# Patient Record
Sex: Female | Born: 1960 | Race: Black or African American | Hispanic: No | Marital: Single | Smoking: Never smoker
Health system: Southern US, Community
[De-identification: ages and names within clinical notes are randomized; demographics above are authoritative.]

## PROBLEM LIST (undated history)

## (undated) DIAGNOSIS — N200 Calculus of kidney: Secondary | ICD-10-CM

## (undated) DIAGNOSIS — E119 Type 2 diabetes mellitus without complications: Secondary | ICD-10-CM

## (undated) DIAGNOSIS — I10 Essential (primary) hypertension: Secondary | ICD-10-CM

## (undated) HISTORY — PX: COLONOSCOPY: SHX174

## (undated) HISTORY — DX: Essential (primary) hypertension: I10

## (undated) HISTORY — PX: TONSILLECTOMY: SUR1361

## (undated) HISTORY — DX: Type 2 diabetes mellitus without complications: E11.9

---
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
## 1999-02-08 ENCOUNTER — Other Ambulatory Visit: Admission: RE | Admit: 1999-02-08 | Discharge: 1999-02-08 | Payer: Self-pay | Admitting: Obstetrics

## 2000-10-02 ENCOUNTER — Other Ambulatory Visit: Admission: RE | Admit: 2000-10-02 | Discharge: 2000-10-02 | Payer: Self-pay | Admitting: Obstetrics

## 2000-12-28 ENCOUNTER — Encounter: Payer: Self-pay | Admitting: Obstetrics

## 2000-12-28 ENCOUNTER — Ambulatory Visit (HOSPITAL_COMMUNITY): Admission: RE | Admit: 2000-12-28 | Discharge: 2000-12-28 | Payer: Self-pay | Admitting: Obstetrics

## 2014-12-04 DIAGNOSIS — N200 Calculus of kidney: Secondary | ICD-10-CM

## 2014-12-04 HISTORY — DX: Calculus of kidney: N20.0

## 2015-04-22 ENCOUNTER — Other Ambulatory Visit (HOSPITAL_COMMUNITY): Payer: Self-pay | Admitting: Internal Medicine

## 2015-04-22 ENCOUNTER — Ambulatory Visit (HOSPITAL_COMMUNITY)
Admission: RE | Admit: 2015-04-22 | Discharge: 2015-04-22 | Disposition: A | Discharge status: Home / Self Care | Payer: 59 | Source: Ambulatory Visit | Attending: Internal Medicine | Admitting: Internal Medicine

## 2015-04-22 DIAGNOSIS — M545 Low back pain, unspecified: Secondary | ICD-10-CM

## 2015-04-22 DIAGNOSIS — N201 Calculus of ureter: Secondary | ICD-10-CM | POA: Insufficient documentation

## 2015-04-22 DIAGNOSIS — R109 Unspecified abdominal pain: Secondary | ICD-10-CM | POA: Diagnosis present

## 2015-04-22 DIAGNOSIS — R310 Gross hematuria: Secondary | ICD-10-CM | POA: Diagnosis not present

## 2015-05-16 ENCOUNTER — Encounter (HOSPITAL_COMMUNITY): Payer: Self-pay

## 2015-05-16 ENCOUNTER — Emergency Department (HOSPITAL_COMMUNITY)
Admission: EM | Admit: 2015-05-16 | Discharge: 2015-05-16 | Disposition: A | Discharge status: Home / Self Care | Payer: 59 | Attending: Emergency Medicine | Admitting: Emergency Medicine

## 2015-05-16 DIAGNOSIS — N200 Calculus of kidney: Secondary | ICD-10-CM | POA: Insufficient documentation

## 2015-05-16 DIAGNOSIS — R109 Unspecified abdominal pain: Secondary | ICD-10-CM | POA: Diagnosis present

## 2015-05-16 LAB — URINALYSIS, ROUTINE W REFLEX MICROSCOPIC
Bilirubin Urine: NEGATIVE
Glucose, UA: NEGATIVE mg/dL
KETONES UR: NEGATIVE mg/dL
Leukocytes, UA: NEGATIVE
Nitrite: NEGATIVE
PH: 8 (ref 5.0–8.0)
Protein, ur: NEGATIVE mg/dL
Specific Gravity, Urine: 1.012 (ref 1.005–1.030)
Urobilinogen, UA: 0.2 mg/dL (ref 0.0–1.0)

## 2015-05-16 LAB — CBC WITH DIFFERENTIAL/PLATELET
Basophils Absolute: 0 10*3/uL (ref 0.0–0.1)
Basophils Relative: 0 % (ref 0–1)
Eosinophils Absolute: 0 10*3/uL (ref 0.0–0.7)
Eosinophils Relative: 0 % (ref 0–5)
HCT: 41.4 % (ref 36.0–46.0)
Hemoglobin: 13.8 g/dL (ref 12.0–15.0)
Lymphocytes Relative: 17 % (ref 12–46)
Lymphs Abs: 2 10*3/uL (ref 0.7–4.0)
MCH: 30.3 pg (ref 26.0–34.0)
MCHC: 33.3 g/dL (ref 30.0–36.0)
MCV: 90.8 fL (ref 78.0–100.0)
Monocytes Absolute: 0.9 10*3/uL (ref 0.1–1.0)
Monocytes Relative: 8 % (ref 3–12)
Neutro Abs: 8.4 10*3/uL — ABNORMAL HIGH (ref 1.7–7.7)
Neutrophils Relative %: 75 % (ref 43–77)
Platelets: 237 10*3/uL (ref 150–400)
RBC: 4.56 MIL/uL (ref 3.87–5.11)
RDW: 12.3 % (ref 11.5–15.5)
WBC: 11.3 10*3/uL — ABNORMAL HIGH (ref 4.0–10.5)

## 2015-05-16 LAB — BASIC METABOLIC PANEL
Anion gap: 11 (ref 5–15)
BUN: 21 mg/dL — ABNORMAL HIGH (ref 6–20)
CO2: 25 mmol/L (ref 22–32)
Calcium: 8.9 mg/dL (ref 8.9–10.3)
Chloride: 103 mmol/L (ref 101–111)
Creatinine, Ser: 1.31 mg/dL — ABNORMAL HIGH (ref 0.44–1.00)
GFR calc Af Amer: 53 mL/min — ABNORMAL LOW (ref >60)
GFR calc non Af Amer: 46 mL/min — ABNORMAL LOW (ref >60)
Glucose, Bld: 151 mg/dL — ABNORMAL HIGH (ref 65–99)
Potassium: 3.3 mmol/L — ABNORMAL LOW (ref 3.5–5.1)
Sodium: 139 mmol/L (ref 135–145)

## 2015-05-16 LAB — URINE MICROSCOPIC-ADD ON

## 2015-05-16 MED ORDER — OXYCODONE-ACETAMINOPHEN 5-325 MG PO TABS
2.0000 | ORAL_TABLET | Freq: Once | ORAL | Status: AC
  Administered 2015-05-16: 2 via ORAL
  Filled 2015-05-16: qty 2

## 2015-05-16 MED ORDER — ONDANSETRON HCL 4 MG/2ML IJ SOLN
4.0000 mg | Freq: Once | INTRAMUSCULAR | Status: AC
  Administered 2015-05-16: 4 mg via INTRAVENOUS
  Filled 2015-05-16: qty 2

## 2015-05-16 MED ORDER — ONDANSETRON HCL 4 MG PO TABS: 4.0000 mg | ORAL_TABLET | Freq: Four times a day (QID) | ORAL | Status: DC

## 2015-05-16 MED ORDER — OXYCODONE-ACETAMINOPHEN 5-325 MG PO TABS: 2.0000 | ORAL_TABLET | Freq: Four times a day (QID) | ORAL | Status: DC | PRN

## 2015-05-16 MED ORDER — HYDROMORPHONE HCL 1 MG/ML IJ SOLN
1.0000 mg | Freq: Once | INTRAMUSCULAR | Status: AC
  Administered 2015-05-16: 1 mg via INTRAMUSCULAR
  Filled 2015-05-16: qty 1

## 2015-05-16 MED ORDER — ONDANSETRON 4 MG PO TBDP
4.0000 mg | ORAL_TABLET | Freq: Once | ORAL | Status: AC
  Administered 2015-05-16: 4 mg via ORAL
  Filled 2015-05-16: qty 1

## 2015-05-16 MED ORDER — HYDROMORPHONE HCL 1 MG/ML IJ SOLN
1.0000 mg | Freq: Once | INTRAMUSCULAR | Status: DC
  Administered 2015-05-16: 1 mg via INTRAVENOUS
  Filled 2015-05-16: qty 1

## 2015-05-16 NOTE — Discharge Instructions (Signed)

## 2015-05-16 NOTE — ED Notes (Signed)
Pt complains of right flank pain since this evening, known 79mm kidney stone

## 2015-05-16 NOTE — ED Provider Notes (Signed)
CSN: [id number]     Arrival date & time 05/16/15  2004 History   First MD Initiated Contact with Patient 05/16/15 2017     Chief Complaint  Patient presents with  . Flank Pain     (Consider location/radiation/quality/duration/timing/severity/associated sxs/prior Treatment) HPI Comments: Patient presents to the emergency department with chief complaint of right flank pain. She states that she was recently diagnosed with a 6 mm kidney stone. She states that today is the first day that she has had pain from the kidney stone. She was initially diagnosed because of hematuria by her primary care provider. She denies any associated fevers, chills, nausea, vomiting, diarrhea, or constipation. She does report urinary frequency and hematuria. There are no aggravating or alleviating factors.  The history is provided by the patient. No language interpreter was used.    History reviewed. No pertinent past medical history. History reviewed. No pertinent past surgical history. History reviewed. No pertinent family history. History  Substance Use Topics  . Smoking status: Never Smoker   . Smokeless tobacco: Not on file  . Alcohol Use: No   OB History    No data available     Review of Systems  Constitutional: Negative for fever and chills.  Respiratory: Negative for shortness of breath.   Cardiovascular: Negative for chest pain.  Gastrointestinal: Positive for abdominal pain. Negative for nausea, vomiting, diarrhea and constipation.  Genitourinary: Negative for dysuria.  All other systems reviewed and are negative.     Allergies  Review of patient's allergies indicates not on file.  Home Medications   Prior to Admission medications   Not on File   BP 189/77 mmHg  Pulse 70  Temp(Src) 98.1 F (36.7 C) (Oral)  Resp 20  Ht 5\' 8"  (1.727 m)  Wt 175 lb (79.379 kg)  BMI 26.61 kg/m2  SpO2 98% Physical Exam  Constitutional: She is oriented to person, place, and time. She appears  well-developed and well-nourished.  HENT:  Head: Normocephalic and atraumatic.  Eyes: Conjunctivae and EOM are normal. Pupils are equal, round, and reactive to light.  Neck: Normal range of motion. Neck supple.  Cardiovascular: Normal rate and regular rhythm.  Exam reveals no gallop and no friction rub.   No murmur heard. Pulmonary/Chest: Effort normal and breath sounds normal. No respiratory distress. She has no wheezes. She has no rales. She exhibits no tenderness.  Abdominal: Soft. Bowel sounds are normal. She exhibits no distension and no mass. There is no tenderness. There is no rebound and no guarding.  Right CVA tenderness  Musculoskeletal: Normal range of motion. She exhibits no edema or tenderness.  Neurological: She is alert and oriented to person, place, and time.  Skin: Skin is warm and dry.  Psychiatric: She has a normal mood and affect. Her behavior is normal. Judgment and thought content normal.  Nursing note and vitals reviewed.   ED Course  Procedures (including critical care time) Results for orders placed or performed during the hospital encounter of 05/16/15  CBC with Differential/Platelet  Result Value Ref Range   WBC 11.3 (H) 4.0 - 10.5 K/uL   RBC 4.56 3.87 - 5.11 MIL/uL   Hemoglobin 13.8 12.0 - 15.0 g/dL   HCT 41.4 36.0 - 46.0 %   MCV 90.8 78.0 - 100.0 fL   MCH 30.3 26.0 - 34.0 pg   MCHC 33.3 30.0 - 36.0 g/dL   RDW 12.3 11.5 - 15.5 %   Platelets 237 150 - 400 K/uL   Neutrophils Relative %  75 43 - 77 %   Neutro Abs 8.4 (H) 1.7 - 7.7 K/uL   Lymphocytes Relative 17 12 - 46 %   Lymphs Abs 2.0 0.7 - 4.0 K/uL   Monocytes Relative 8 3 - 12 %   Monocytes Absolute 0.9 0.1 - 1.0 K/uL   Eosinophils Relative 0 0 - 5 %   Eosinophils Absolute 0.0 0.0 - 0.7 K/uL   Basophils Relative 0 0 - 1 %   Basophils Absolute 0.0 0.0 - 0.1 K/uL  Basic metabolic panel  Result Value Ref Range   Sodium 139 135 - 145 mmol/L   Potassium 3.3 (L) 3.5 - 5.1 mmol/L   Chloride 103 101 -  111 mmol/L   CO2 25 22 - 32 mmol/L   Glucose, Bld 151 (H) 65 - 99 mg/dL   BUN 21 (H) 6 - 20 mg/dL   Creatinine, Ser 1.31 (H) 0.44 - 1.00 mg/dL   Calcium 8.9 8.9 - 10.3 mg/dL   GFR calc non Af Amer 46 (L) >60 mL/min   GFR calc Af Amer 53 (L) >60 mL/min   Anion gap 11 5 - 15  Urinalysis, Routine w reflex microscopic (not at [facility])  Result Value Ref Range   Color, Urine YELLOW YELLOW   APPearance CLOUDY (A) CLEAR   Specific Gravity, Urine 1.012 1.005 - 1.030   pH 8.0 5.0 - 8.0   Glucose, UA NEGATIVE NEGATIVE mg/dL   Hgb urine dipstick SMALL (A) NEGATIVE   Bilirubin Urine NEGATIVE NEGATIVE   Ketones, ur NEGATIVE NEGATIVE mg/dL   Protein, ur NEGATIVE NEGATIVE mg/dL   Urobilinogen, UA 0.2 0.0 - 1.0 mg/dL   Nitrite NEGATIVE NEGATIVE   Leukocytes, UA NEGATIVE NEGATIVE  Urine microscopic-add on  Result Value Ref Range   WBC, UA 0-2 <3 WBC/hpf   RBC / HPF 7-10 <3 RBC/hpf   Ct Abdomen Pelvis Wo Contrast  04/22/2015   CLINICAL DATA:  Right flank pain with gross hematuria for several days  EXAM: CT ABDOMEN AND PELVIS WITHOUT CONTRAST  TECHNIQUE: Multidetector CT imaging of the abdomen and pelvis was performed following the standard protocol without IV contrast.  COMPARISON:  None.  FINDINGS: Lung bases are free of acute infiltrate or sizable effusion.  The liver is diffusely decreased in attenuation consistent with fatty infiltration. A rounded hypodensity is identified measuring 5.1 cm in greatest dimension likely representing a cyst. The spleen, adrenal glands and gallbladder are within normal limits. The pancreas is unremarkable. Kidneys are well visualized and reveal no renal calculi or obstructive changes. There is however a 6 mm stone identified in the mid to distal right ureter without definitive obstructive changes. This is likely the etiology of the patient's underlying discomfort.  The appendix is within normal limits. Diverticular change of the colon is noted. The bladder is decompressed.  The uterus is within normal limits. No acute bony abnormality is seen.  IMPRESSION: 6 mm mid to distal right ureteral stone without significant obstructive change.   Electronically Signed   By: Inez Catalina M.D.   On: 04/22/2015 11:34     Imaging Review No results found.   EKG Interpretation None      MDM   Final diagnoses:  Kidney stone    Patient with known 6 mm right-sided kidney stone. Just became painful today. She has urology follow-up. Will check labs and urinalysis.  Labs show markable for mild increase of creatinine to 1.3, will recommend increase fluids. Pain controlled with Dilaudid. Feel that she is  appropriate for outpatient follow-up. Return precautions given. Vital signs are stable. Patient is well/nontoxic appearing. She is stable and ready for discharge.    Montine Circle, PA-C 05/16/15 2633  Elnora Morrison, MD 05/16/15 [phone]

## 2015-05-17 ENCOUNTER — Ambulatory Visit (HOSPITAL_COMMUNITY): Payer: 59 | Admitting: Certified Registered Nurse Anesthetist

## 2015-05-17 ENCOUNTER — Encounter (HOSPITAL_COMMUNITY): Payer: Self-pay | Admitting: *Deleted

## 2015-05-17 ENCOUNTER — Ambulatory Visit (HOSPITAL_COMMUNITY)
Admission: RE | Admit: 2015-05-17 | Discharge: 2015-05-17 | Disposition: A | Discharge status: Home / Self Care | Payer: 59 | Source: Ambulatory Visit | Attending: Urology | Admitting: Urology

## 2015-05-17 ENCOUNTER — Other Ambulatory Visit: Payer: Self-pay | Admitting: Urology

## 2015-05-17 ENCOUNTER — Encounter (HOSPITAL_COMMUNITY)
Admission: RE | Disposition: A | Discharge status: Home / Self Care | Payer: Self-pay | Source: Ambulatory Visit | Attending: Urology

## 2015-05-17 DIAGNOSIS — N132 Hydronephrosis with renal and ureteral calculous obstruction: Secondary | ICD-10-CM | POA: Insufficient documentation

## 2015-05-17 DIAGNOSIS — R109 Unspecified abdominal pain: Secondary | ICD-10-CM | POA: Diagnosis present

## 2015-05-17 DIAGNOSIS — Z87442 Personal history of urinary calculi: Secondary | ICD-10-CM | POA: Diagnosis not present

## 2015-05-17 HISTORY — PX: HOLMIUM LASER APPLICATION: SHX5852

## 2015-05-17 HISTORY — PX: CYSTOSCOPY WITH RETROGRADE PYELOGRAM, URETEROSCOPY AND STENT PLACEMENT: SHX5789

## 2015-05-17 SURGERY — CYSTOURETEROSCOPY, WITH RETROGRADE PYELOGRAM AND STENT INSERTION
Anesthesia: General | Laterality: Right

## 2015-05-17 MED ORDER — CEFAZOLIN SODIUM-DEXTROSE 2-3 GM-% IV SOLR
2.0000 g | Freq: Once | INTRAVENOUS | Status: AC
  Administered 2015-05-17: 2 g via INTRAVENOUS

## 2015-05-17 MED ORDER — PROMETHAZINE HCL 25 MG/ML IJ SOLN: 6.2500 mg | INTRAMUSCULAR | Status: DC | PRN

## 2015-05-17 MED ORDER — LACTATED RINGERS IV SOLN
INTRAVENOUS | Status: DC
  Administered 2015-05-17: 18:00:00 via INTRAVENOUS
  Administered 2015-05-17: 1000 mL via INTRAVENOUS

## 2015-05-17 MED ORDER — PROPOFOL 10 MG/ML IV BOLUS
INTRAVENOUS | Status: AC
  Filled 2015-05-17: qty 20

## 2015-05-17 MED ORDER — LIDOCAINE HCL (CARDIAC) 20 MG/ML IV SOLN
INTRAVENOUS | Status: AC
  Filled 2015-05-17: qty 5

## 2015-05-17 MED ORDER — PHENYLEPHRINE HCL 10 MG/ML IJ SOLN
INTRAMUSCULAR | Status: DC | PRN
  Administered 2015-05-17: 80 ug via INTRAVENOUS

## 2015-05-17 MED ORDER — TAMSULOSIN HCL 0.4 MG PO CAPS: 0.4000 mg | ORAL_CAPSULE | Freq: Every day | ORAL | Status: DC

## 2015-05-17 MED ORDER — PROPOFOL 10 MG/ML IV BOLUS
INTRAVENOUS | Status: DC | PRN
  Administered 2015-05-17: 150 mg via INTRAVENOUS

## 2015-05-17 MED ORDER — IOHEXOL 300 MG/ML  SOLN
INTRAMUSCULAR | Status: DC | PRN
  Administered 2015-05-17: 5 mL via INTRAVENOUS

## 2015-05-17 MED ORDER — OXYCODONE-ACETAMINOPHEN 5-325 MG PO TABS: 2.0000 | ORAL_TABLET | Freq: Four times a day (QID) | ORAL | Status: DC | PRN

## 2015-05-17 MED ORDER — ONDANSETRON HCL 4 MG PO TABS: 4.0000 mg | ORAL_TABLET | Freq: Four times a day (QID) | ORAL | Status: DC

## 2015-05-17 MED ORDER — CEFAZOLIN SODIUM-DEXTROSE 2-3 GM-% IV SOLR
INTRAVENOUS | Status: AC
  Filled 2015-05-17: qty 50

## 2015-05-17 MED ORDER — FENTANYL CITRATE (PF) 100 MCG/2ML IJ SOLN
INTRAMUSCULAR | Status: DC | PRN
  Administered 2015-05-17: 50 ug via INTRAVENOUS

## 2015-05-17 MED ORDER — HYDROMORPHONE HCL 1 MG/ML IJ SOLN: 0.2500 mg | INTRAMUSCULAR | Status: DC | PRN

## 2015-05-17 MED ORDER — MIDAZOLAM HCL 2 MG/2ML IJ SOLN
INTRAMUSCULAR | Status: AC
  Filled 2015-05-17: qty 2

## 2015-05-17 MED ORDER — MIDAZOLAM HCL 5 MG/5ML IJ SOLN
INTRAMUSCULAR | Status: DC | PRN
  Administered 2015-05-17: 2 mg via INTRAVENOUS

## 2015-05-17 MED ORDER — FENTANYL CITRATE (PF) 100 MCG/2ML IJ SOLN
INTRAMUSCULAR | Status: AC
  Filled 2015-05-17: qty 2

## 2015-05-17 MED ORDER — ONDANSETRON HCL 4 MG/2ML IJ SOLN
INTRAMUSCULAR | Status: AC
  Filled 2015-05-17: qty 2

## 2015-05-17 MED ORDER — PHENYLEPHRINE 40 MCG/ML (10ML) SYRINGE FOR IV PUSH (FOR BLOOD PRESSURE SUPPORT)
PREFILLED_SYRINGE | INTRAVENOUS | Status: AC
  Filled 2015-05-17: qty 10

## 2015-05-17 MED ORDER — ONDANSETRON HCL 4 MG/2ML IJ SOLN
INTRAMUSCULAR | Status: DC | PRN
  Administered 2015-05-17: 4 mg via INTRAVENOUS

## 2015-05-17 SURGICAL SUPPLY — 22 items
BAG URO CATCHER STRL LF (DRAPE) ×2 IMPLANT
BASKET LASER NITINOL 1.9FR (BASKET) IMPLANT
BSKT STON RTRVL 120 1.9FR (BASKET)
CATH INTERMIT  6FR 70CM (CATHETERS) ×2 IMPLANT
CLOTH BEACON ORANGE TIMEOUT ST (SAFETY) ×2 IMPLANT
EXTRACTOR STONE NITINOL NGAGE (UROLOGICAL SUPPLIES) ×1 IMPLANT
FIBER LASER FLEXIVA 1000 (UROLOGICAL SUPPLIES) IMPLANT
FIBER LASER FLEXIVA 200 (UROLOGICAL SUPPLIES) ×1 IMPLANT
FIBER LASER FLEXIVA 365 (UROLOGICAL SUPPLIES) IMPLANT
FIBER LASER FLEXIVA 550 (UROLOGICAL SUPPLIES) IMPLANT
FIBER LASER TRAC TIP (UROLOGICAL SUPPLIES) IMPLANT
GLOVE BIOGEL M STRL SZ7.5 (GLOVE) ×2 IMPLANT
GOWN STRL REUS W/TWL LRG LVL3 (GOWN DISPOSABLE) ×4 IMPLANT
GUIDEWIRE ANG ZIPWIRE 038X150 (WIRE) ×3 IMPLANT
GUIDEWIRE STR DUAL SENSOR (WIRE) ×2 IMPLANT
IV NS 1000ML (IV SOLUTION) ×2
IV NS 1000ML BAXH (IV SOLUTION) ×1 IMPLANT
MANIFOLD NEPTUNE II (INSTRUMENTS) ×2 IMPLANT
PACK CYSTO (CUSTOM PROCEDURE TRAY) ×2 IMPLANT
SYR CONTROL 10ML LL (SYRINGE) ×2 IMPLANT
TUBE FEEDING 8FR 16IN STR KANG (MISCELLANEOUS) ×2 IMPLANT
TUBING CONNECTING 10 (TUBING) ×2 IMPLANT

## 2015-05-17 NOTE — Anesthesia Preprocedure Evaluation (Signed)
Anesthesia Evaluation  Patient identified by MRN, date of birth, ID band Patient awake    Reviewed: Allergy & Precautions, NPO status , Patient's Chart, lab work & pertinent test results  Airway Mallampati: II  TM Distance: >3 FB Neck ROM: Full    Dental   Pulmonary neg pulmonary ROS,  breath sounds clear to auscultation        Cardiovascular negative cardio ROS  Rhythm:Regular Rate:Normal     Neuro/Psych    GI/Hepatic negative GI ROS, Neg liver ROS,   Endo/Other  negative endocrine ROS  Renal/GU History noted. CE     Musculoskeletal   Abdominal   Peds  Hematology   Anesthesia Other Findings   Reproductive/Obstetrics                             Anesthesia Physical Anesthesia Plan  ASA: II  Anesthesia Plan: General   Post-op Pain Management:    Induction: Intravenous  Airway Management Planned: Oral ETT  Additional Equipment:   Intra-op Plan:   Post-operative Plan: Extubation in OR  Informed Consent: I have reviewed the patients History and Physical, chart, labs and discussed the procedure including the risks, benefits and alternatives for the proposed anesthesia with the patient or authorized representative who has indicated his/her understanding and acceptance.   Dental advisory given  Plan Discussed with: CRNA and Anesthesiologist  Anesthesia Plan Comments:         Anesthesia Quick Evaluation

## 2015-05-17 NOTE — Anesthesia Procedure Notes (Signed)
Procedure Name: LMA Insertion Date/Time: 05/17/2015 5:29 PM Performed by: Johnathan Hausen A Pre-anesthesia Checklist: Patient identified, Emergency Drugs available, Suction available, Patient being monitored and Timeout performed Patient Re-evaluated:Patient Re-evaluated prior to inductionOxygen Delivery Method: Circle system utilized Preoxygenation: Pre-oxygenation with 100% oxygen Intubation Type: Combination inhalational/ intravenous induction Ventilation: Mask ventilation without difficulty LMA: LMA with gastric port inserted LMA Size: 4.0 Number of attempts: 1 Placement Confirmation: positive ETCO2 and breath sounds checked- equal and bilateral Tube secured with: Tape Dental Injury: Teeth and Oropharynx as per pre-operative assessment

## 2015-05-17 NOTE — Anesthesia Postprocedure Evaluation (Signed)
  Anesthesia Post-op Note  Patient: Sophia Higgins  Procedure(s) Performed: Procedure(s): CYSTOSCOPY WITH RETROGRADE PYELOGRAM, URETEROSCOPY AND STENT PLACEMENT (Right) HOLMIUM LASER APPLICATION (Right)  Patient Location: PACU  Anesthesia Type:General  Level of Consciousness: awake  Airway and Oxygen Therapy: Patient Spontanous Breathing  Post-op Pain: mild  Post-op Assessment: Post-op Vital signs reviewed              Post-op Vital Signs: Reviewed  Last Vitals:  Filed Vitals:   05/17/15 1845  BP: 168/71  Pulse: 57  Temp:   Resp: 13    Complications: No apparent anesthesia complications

## 2015-05-17 NOTE — Transfer of Care (Signed)
Immediate Anesthesia Transfer of Care Note  Patient: Sophia Higgins  Procedure(s) Performed: Procedure(s): CYSTOSCOPY WITH RETROGRADE PYELOGRAM, URETEROSCOPY AND STENT PLACEMENT (Right) HOLMIUM LASER APPLICATION (Right)  Patient Location: PACU  Anesthesia Type:General  Level of Consciousness: awake, sedated and patient cooperative  Airway & Oxygen Therapy: Patient Spontanous Breathing and Patient connected to face mask oxygen  Post-op Assessment: Report given to RN and Post -op Vital signs reviewed and stable  Post vital signs: Reviewed and stable  Last Vitals: There were no vitals filed for this visit.  Complications: No apparent anesthesia complications

## 2015-05-17 NOTE — H&P (Signed)
Urology Admission H&P  Chief Complaint: right flank pain  History of Present Illness: Sophia Higgins is a 54yo with a known right ureteral stone who presented to my office this morning with worsening right flank pain. She has nausea. Pain is sharp, intermittent, radiates to her abdomen. She denies fevers/chill/sweats.  History reviewed. No pertinent past medical history. Past Surgical History  Procedure Laterality Date  . Colonoscopy    . Tonsillectomy      Home Medications:  Prescriptions prior to admission  Medication Sig Dispense Refill Last Dose  . hydrochlorothiazide (MICROZIDE) 12.5 MG capsule Take 12.5 mg by mouth every other day.   Past Week at Unknown time  . ondansetron (ZOFRAN) 4 MG tablet Take 1 tablet (4 mg total) by mouth every 6 (six) hours. 12 tablet 0 05/16/2015 at Unknown time  . oxyCODONE-acetaminophen (PERCOCET/ROXICET) 5-325 MG per tablet Take 2 tablets by mouth every 6 (six) hours as needed for severe pain. 15 tablet 0 05/16/2015 at Unknown time  . tamsulosin (FLOMAX) 0.4 MG CAPS capsule Take 0.4 mg by mouth daily.   05/16/2015 at Unknown time   Allergies:  Allergies  Allergen Reactions  . Flomax [Tamsulosin Hcl] Hives    History reviewed. No pertinent family history. Social History:  reports that she has never smoked. She does not have any smokeless tobacco history on file. She reports that she does not drink alcohol. Her drug history is not on file.  Review of Systems  All other systems reviewed and are negative.   Physical Exam:  Vital signs in last 24 hours: Temp:  [98.1 F (36.7 C)] 98.1 F (36.7 C) (06/12 2013) Pulse Rate:  [64-70] 69 (06/12 2302) Resp:  [20] 20 (06/12 2302) BP: (172-189)/(68-77) 172/68 mmHg (06/12 2302) SpO2:  [98 %-100 %] 100 % (06/12 2302) Weight:  [79.379 kg (175 lb)] 79.379 kg (175 lb) (06/12 2013) Physical Exam  Constitutional: She is oriented to person, place, and time. She appears well-developed and well-nourished. No distress.   HENT:  Head: Normocephalic and atraumatic.  Eyes: EOM are normal. Pupils are equal, round, and reactive to light.  Neck: Normal range of motion. Neck supple.  Cardiovascular: Normal rate and regular rhythm.   Respiratory: Effort normal and breath sounds normal.  GI: Soft. She exhibits no distension and no mass. There is no tenderness. There is no rebound and no guarding.  Musculoskeletal: Normal range of motion. She exhibits no edema or tenderness.  Neurological: She is alert and oriented to person, place, and time.  Skin: Skin is warm and dry. No rash noted. She is not diaphoretic. No erythema. No pallor.  Psychiatric: She has a normal mood and affect. Her behavior is normal. Judgment and thought content normal.    Laboratory Data:  Results for orders placed or performed during the hospital encounter of 05/16/15 (from the past 24 hour(s))  Urinalysis, Routine w reflex microscopic (not at [facility])     Status: Abnormal   Collection Time: 05/16/15  9:01 PM  Result Value Ref Range   Color, Urine YELLOW YELLOW   APPearance CLOUDY (A) CLEAR   Specific Gravity, Urine 1.012 1.005 - 1.030   pH 8.0 5.0 - 8.0   Glucose, UA NEGATIVE NEGATIVE mg/dL   Hgb urine dipstick SMALL (A) NEGATIVE   Bilirubin Urine NEGATIVE NEGATIVE   Ketones, ur NEGATIVE NEGATIVE mg/dL   Protein, ur NEGATIVE NEGATIVE mg/dL   Urobilinogen, UA 0.2 0.0 - 1.0 mg/dL   Nitrite NEGATIVE NEGATIVE   Leukocytes, UA NEGATIVE  NEGATIVE  Urine microscopic-add on     Status: None   Collection Time: 05/16/15  9:01 PM  Result Value Ref Range   WBC, UA 0-2 <3 WBC/hpf   RBC / HPF 7-10 <3 RBC/hpf  CBC with Differential/Platelet     Status: Abnormal   Collection Time: 05/16/15  9:36 PM  Result Value Ref Range   WBC 11.3 (H) 4.0 - 10.5 K/uL   RBC 4.56 3.87 - 5.11 MIL/uL   Hemoglobin 13.8 12.0 - 15.0 g/dL   HCT 41.4 36.0 - 46.0 %   MCV 90.8 78.0 - 100.0 fL   MCH 30.3 26.0 - 34.0 pg   MCHC 33.3 30.0 - 36.0 g/dL   RDW 12.3 11.5 - 15.5  %   Platelets 237 150 - 400 K/uL   Neutrophils Relative % 75 43 - 77 %   Neutro Abs 8.4 (H) 1.7 - 7.7 K/uL   Lymphocytes Relative 17 12 - 46 %   Lymphs Abs 2.0 0.7 - 4.0 K/uL   Monocytes Relative 8 3 - 12 %   Monocytes Absolute 0.9 0.1 - 1.0 K/uL   Eosinophils Relative 0 0 - 5 %   Eosinophils Absolute 0.0 0.0 - 0.7 K/uL   Basophils Relative 0 0 - 1 %   Basophils Absolute 0.0 0.0 - 0.1 K/uL  Basic metabolic panel     Status: Abnormal   Collection Time: 05/16/15  9:36 PM  Result Value Ref Range   Sodium 139 135 - 145 mmol/L   Potassium 3.3 (L) 3.5 - 5.1 mmol/L   Chloride 103 101 - 111 mmol/L   CO2 25 22 - 32 mmol/L   Glucose, Bld 151 (H) 65 - 99 mg/dL   BUN 21 (H) 6 - 20 mg/dL   Creatinine, Ser 1.31 (H) 0.44 - 1.00 mg/dL   Calcium 8.9 8.9 - 10.3 mg/dL   GFR calc non Af Amer 46 (L) >60 mL/min   GFR calc Af Amer 53 (L) >60 mL/min   Anion gap 11 5 - 15   No results found for this or any previous visit (from the past 240 hour(s)). Creatinine:  Recent Labs  05/16/15 2136  CREATININE 1.31*   Baseline Creatinine: 0.9  Impression/Assessment:  Right ureteral stone  Plan:  Risks/benefits/alternatives to right ureteroscopic stone extraction was explained to the patient and she understands and wishes to proceed with surgery.  Talbot Monarch L 05/17/2015, 4:42 PM

## 2015-05-17 NOTE — Brief Op Note (Signed)
05/17/2015  6:17 PM  PATIENT:  March Rummage Hipp  54 y.o. female  PRE-OPERATIVE DIAGNOSIS:  right ureteral stone  POST-OPERATIVE DIAGNOSIS:  Right ureteral stone  PROCEDURE:  Procedure(s): CYSTOSCOPY WITH RETROGRADE PYELOGRAM, URETEROSCOPY AND STENT PLACEMENT (Right) HOLMIUM LASER APPLICATION (Right)  SURGEON:  Surgeon(s) and Role:    * Cleon Gustin, MD - Primary  PHYSICIAN ASSISTANT:   ASSISTANTS: none   ANESTHESIA:   general  EBL:  Total I/O In: 1000 [I.V.:1000] Out: -   BLOOD ADMINISTERED:none  DRAINS: 6x26 JJ stent without tether   LOCAL MEDICATIONS USED:  NONE  SPECIMEN:  Source of Specimen:  stone  DISPOSITION OF SPECIMEN:  PATHOLOGY  COUNTS:  YES  TOURNIQUET:  * No tourniquets in log *  DICTATION: .Other Dictation: Dictation Number [id number]  PLAN OF CARE: Discharge to home after PACU  PATIENT DISPOSITION:  PACU - hemodynamically stable.   Delay start of Pharmacological VTE agent (>24hrs) due to surgical blood loss or risk of bleeding: no

## 2015-05-18 ENCOUNTER — Encounter (HOSPITAL_COMMUNITY): Payer: Self-pay | Admitting: Urology

## 2015-05-21 LAB — STONE ANALYSIS
CA OXALATE, DIHYDRATE: 20 %
Ca Oxalate,Monohydr.: 75 %
Ca phos cry stone ql IR: 5 %
STONE WEIGHT KSTONE: 34 mg

## 2015-06-07 NOTE — Op Note (Signed)
Preoperative diagnosis: Right ureteral stone  Postoperative diagnosis: Same  Procedure: 1 cystoscopy 2 right retrograde pyelography 3.  Intraoperative fluoroscopy, under one hour, with interpretation 4.  Right ureteroscopic stone manipulation with laser lithotripsy 5.  Right 6 x 26 JJ stent placement  Attending: Rosie Fate  Anesthesia: General  Estimated blood loss: None  Drains: Right 6 x 26 JJ ureteral stent without tether  Antibiotics: Ancef  Findings: Right mid ureteral stone, with proximal hydroureteronephrosis.  Indications: Patient is a 54 year old female with a history of ureteral stone and who has failed medical expulsive therapy.  After discussing treatment options, she decided proceed with right ureteroscopic stone manipulation.  Procedure her in detail: The patient was brought to the operating room and a brief timeout was done to ensure correct patient, correct procedure, correct site.  General anesthesia was administered patient was placed in dorsal lithotomy position.  Her genitalia was then prepped and draped in usual sterile fashion.  A rigid 38 French cystoscope was passed in the urethra and the bladder.  Bladder was inspected free masses or lesions.  the right ureteral orifices were in the normal orthotopic locations.  a 6 french ureteral catheter was then instilled into the right ureter orifice.  a gentle retrograde was obtained and findings noted above.  we then placed a zip wire through the ureteral catheter and advanced up to the renal pelvis.  we then removed the cystoscope and cannulated the right ureteral orifice with a semirigid ureteroscope.  we then encountered the stone in the mid ureter.  using using a 200 nm laser fiber and fragmented the stone into smaller pieces.  the pieces were then removed with a engage basket.  We then placed and good coil was noted in the the renal pelvis under fluoroscopy and the bladder under direct vision.   once all stone  fragments were removed we then placed a 6 x 26 double-j ureteral stent over the original zip wire.  the stone fragments were then removed from the bladder and sent for analysis.   the bladder was then drained and this concluded the procedure which was well tolerated by patient.  Complications: None  Condition: Stable, extubated, transferred to PACU  Plan: Patient is to be discharged home as to follow-up in one week for stent removal.

## 2015-09-02 ENCOUNTER — Ambulatory Visit: Payer: Self-pay | Admitting: Podiatry

## 2015-09-13 ENCOUNTER — Ambulatory Visit (INDEPENDENT_AMBULATORY_CARE_PROVIDER_SITE_OTHER): Payer: 59 | Admitting: Podiatry

## 2015-09-13 ENCOUNTER — Ambulatory Visit (INDEPENDENT_AMBULATORY_CARE_PROVIDER_SITE_OTHER): Payer: 59

## 2015-09-13 ENCOUNTER — Encounter: Payer: Self-pay | Admitting: Podiatry

## 2015-09-13 VITALS — BP 177/97 | HR 80 | Resp 16

## 2015-09-13 DIAGNOSIS — M779 Enthesopathy, unspecified: Secondary | ICD-10-CM | POA: Diagnosis not present

## 2015-09-13 DIAGNOSIS — M21619 Bunion of unspecified foot: Secondary | ICD-10-CM | POA: Diagnosis not present

## 2015-09-13 DIAGNOSIS — M79673 Pain in unspecified foot: Secondary | ICD-10-CM | POA: Diagnosis not present

## 2015-09-13 DIAGNOSIS — M205X9 Other deformities of toe(s) (acquired), unspecified foot: Secondary | ICD-10-CM

## 2015-09-13 MED ORDER — TRIAMCINOLONE ACETONIDE 10 MG/ML IJ SUSP
10.0000 mg | Freq: Once | INTRAMUSCULAR | Status: AC
  Administered 2015-09-13: 10 mg

## 2015-09-13 NOTE — Progress Notes (Signed)
   Subjective:    Patient ID: Sophia Higgins, female    DOB: Jan 31, 1961, 54 y.o.   MRN: [id number]  HPI Pt presents with bilateral tingling pain in both feet, left over right. Lasting 1 month   Review of Systems  Constitutional: Positive for fatigue.  All other systems reviewed and are negative.      Objective:   Physical Exam        Assessment & Plan:

## 2015-09-16 NOTE — Progress Notes (Signed)
Subjective:     Patient ID: Sophia Higgins, female   DOB: March 20, 1961, 54 y.o.   MRN: [id number]  HPI patient states I've had pain around my big toe joint left over right for long time and I developed some discomfort in the remainder of my foot which is not as intense but present with mild tingling at nighttime. States the big toe joint has hurt him for a long time   Review of Systems  All other systems reviewed and are negative.      Objective:   Physical Exam  Constitutional: She is oriented to person, place, and time.  Cardiovascular: Intact distal pulses.   Musculoskeletal: Normal range of motion.  Neurological: She is oriented to person, place, and time.  Skin: Skin is warm.  Nursing note and vitals reviewed.  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

## 2015-10-11 ENCOUNTER — Ambulatory Visit: Payer: 59 | Admitting: Podiatry

## 2015-11-04 ENCOUNTER — Encounter: Payer: Self-pay | Admitting: Podiatry

## 2015-11-04 ENCOUNTER — Ambulatory Visit (INDEPENDENT_AMBULATORY_CARE_PROVIDER_SITE_OTHER): Payer: 59 | Admitting: Podiatry

## 2015-11-04 VITALS — BP 157/79 | HR 82 | Resp 16

## 2015-11-04 DIAGNOSIS — G5762 Lesion of plantar nerve, left lower limb: Secondary | ICD-10-CM | POA: Diagnosis not present

## 2015-11-04 DIAGNOSIS — D3610 Benign neoplasm of peripheral nerves and autonomic nervous system, unspecified: Secondary | ICD-10-CM

## 2015-11-04 DIAGNOSIS — M205X9 Other deformities of toe(s) (acquired), unspecified foot: Secondary | ICD-10-CM

## 2015-11-07 NOTE — Progress Notes (Signed)
Subjective:     Patient ID: Sophia Higgins, female   DOB: August 07, 1961, 54 y.o.   MRN: [id number]  HPI patient states I'm doing pretty well with my big toe joint feeling quite a bit better but I have a lot of pain between the third and fourth toes on my left foot with shooting radiating-like discomfort   Review of Systems     Objective:   Physical Exam Neurovascular status intact with continued limitation of motion first MPJ with moderate discomfort on the lateral side and shooting pains third interspace left foot with radiating discomfort into the adjacent digits    Assessment:     Hallux limitus deformity with inflammation still present left along with neuroma symptomatology left    Plan:     H&P and both conditions reviewed. Someday surgery will probably be necessary for the hallux limitus deformity but at this time her to continue to treat the underlying conditions. Get a focus on the nerve at this time and I did go ahead and I injected the third interspace with a purified alcohol Marcaine solution 1.5 mL which was tolerated well

## 2015-11-18 ENCOUNTER — Ambulatory Visit: Payer: 59 | Admitting: Podiatry

## 2015-12-13 ENCOUNTER — Ambulatory Visit: Payer: 59 | Admitting: Podiatry

## 2015-12-23 ENCOUNTER — Ambulatory Visit: Payer: 59 | Admitting: Podiatry

## 2016-01-03 MED FILL — HYDROCHLOROTHIAZIDE 12.5 MG: 12.5 | 90 days supply | Qty: 90 | Fill #1

## 2016-02-21 DIAGNOSIS — Z1231 Encounter for screening mammogram for malignant neoplasm of breast: Secondary | ICD-10-CM | POA: Diagnosis not present

## 2016-03-21 DIAGNOSIS — Z6830 Body mass index (BMI) 30.0-30.9, adult: Secondary | ICD-10-CM | POA: Diagnosis not present

## 2016-03-21 DIAGNOSIS — Z01419 Encounter for gynecological examination (general) (routine) without abnormal findings: Secondary | ICD-10-CM | POA: Diagnosis not present

## 2016-03-21 DIAGNOSIS — Z124 Encounter for screening for malignant neoplasm of cervix: Secondary | ICD-10-CM | POA: Diagnosis not present

## 2016-03-23 ENCOUNTER — Encounter: Payer: Self-pay | Admitting: Podiatry

## 2016-03-23 ENCOUNTER — Ambulatory Visit (INDEPENDENT_AMBULATORY_CARE_PROVIDER_SITE_OTHER): Payer: 59 | Admitting: Podiatry

## 2016-03-23 VITALS — BP 130/80 | HR 75 | Resp 16

## 2016-03-23 DIAGNOSIS — M779 Enthesopathy, unspecified: Secondary | ICD-10-CM

## 2016-03-23 DIAGNOSIS — M205X9 Other deformities of toe(s) (acquired), unspecified foot: Secondary | ICD-10-CM

## 2016-03-23 MED ORDER — TRIAMCINOLONE ACETONIDE 10 MG/ML IJ SUSP
10.0000 mg | Freq: Once | INTRAMUSCULAR | Status: AC
  Administered 2016-03-23: 10 mg

## 2016-03-23 NOTE — Progress Notes (Signed)
Subjective:     Patient ID: Sophia Higgins, female   DOB: 08/16/1961, 55 y.o.   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

## 2016-03-29 DIAGNOSIS — M545 Low back pain: Secondary | ICD-10-CM | POA: Diagnosis not present

## 2016-03-29 DIAGNOSIS — S39012A Strain of muscle, fascia and tendon of lower back, initial encounter: Secondary | ICD-10-CM | POA: Diagnosis not present

## 2016-03-29 DIAGNOSIS — I10 Essential (primary) hypertension: Secondary | ICD-10-CM | POA: Diagnosis not present

## 2016-03-29 MED FILL — HYDROCHLOROTHIAZIDE 12.5 MG: 12.5 | 90 days supply | Qty: 180 | Fill #0

## 2016-03-29 MED FILL — CYCLOBENZAPRINE 10 MG TAB: 10 | 10 days supply | Qty: 30 | Fill #0

## 2016-03-30 DIAGNOSIS — N200 Calculus of kidney: Secondary | ICD-10-CM | POA: Diagnosis not present

## 2016-03-30 DIAGNOSIS — Z0000 Encounter for general adult medical examination without abnormal findings: Secondary | ICD-10-CM | POA: Diagnosis not present

## 2018-04-01 ENCOUNTER — Other Ambulatory Visit: Payer: Self-pay | Admitting: Family Medicine

## 2018-04-01 DIAGNOSIS — R740 Nonspecific elevation of levels of transaminase and lactic acid dehydrogenase [LDH]: Principal | ICD-10-CM

## 2018-04-01 DIAGNOSIS — R7401 Elevation of levels of liver transaminase levels: Secondary | ICD-10-CM

## 2018-04-02 ENCOUNTER — Ambulatory Visit
Admission: RE | Admit: 2018-04-02 | Discharge: 2018-04-02 | Disposition: A | Discharge status: Home / Self Care | Payer: 59 | Source: Ambulatory Visit | Attending: Family Medicine | Admitting: Family Medicine

## 2018-04-02 DIAGNOSIS — R7401 Elevation of levels of liver transaminase levels: Secondary | ICD-10-CM

## 2018-04-02 DIAGNOSIS — R740 Nonspecific elevation of levels of transaminase and lactic acid dehydrogenase [LDH]: Principal | ICD-10-CM

## 2018-09-24 ENCOUNTER — Encounter: Payer: Self-pay | Admitting: Internal Medicine

## 2018-09-24 ENCOUNTER — Ambulatory Visit: Payer: Managed Care, Other (non HMO) | Attending: Internal Medicine | Admitting: Internal Medicine

## 2018-09-24 VITALS — BP 175/91 | HR 74 | Temp 98.5°F | Resp 16 | Ht 68.0 in | Wt 195.2 lb

## 2018-09-24 DIAGNOSIS — Z9889 Other specified postprocedural states: Secondary | ICD-10-CM | POA: Insufficient documentation

## 2018-09-24 DIAGNOSIS — I10 Essential (primary) hypertension: Secondary | ICD-10-CM | POA: Diagnosis not present

## 2018-09-24 DIAGNOSIS — Z131 Encounter for screening for diabetes mellitus: Secondary | ICD-10-CM | POA: Diagnosis not present

## 2018-09-24 DIAGNOSIS — R202 Paresthesia of skin: Secondary | ICD-10-CM

## 2018-09-24 MED ORDER — AMLODIPINE BESYLATE 10 MG PO TABS: 10.0000 mg | ORAL_TABLET | Freq: Every day | ORAL | 6 refills | Status: DC

## 2018-09-24 NOTE — Progress Notes (Signed)
Patient ID: SHAM ALVIAR, female    DOB: 1961/12/04  MRN: [id number]  CC: New Patient (Initial Visit)   Subjective: Sophia Higgins is a 57 y.o. female who presents for new pt visit Her concerns today include:    PCP is Dr. Lennette Bihari Via with [facility].  Last seen in April.  Patient states that she does not plan to change PCP but came today wanting a second opinion about some symptoms she has been having since April of this year.   Hx of HTN on Amlodipine  C/o burning and tingling intermittently in lower back both sides, legs and feet and arms and hands since April of this year.  She recalls no initiating factors.  No associated weakness in the extremities, blurred vision or headaches. -It initially started in April and lasted several weeks.  Eventually it went away and then recurred in July.  Up until last week she was having tingling in the arms and legs mainly the thighs.  She states that her PCP had done some baseline blood work back in April and she was told that everything was okay except for mild elevation in liver enzymes.  She subsequently underwent a liver ultrasound which came back normal.    HTN: on Amlodipine 5 mg daily.  She took medication already for today.  Checks BP once a mth.  Last reading  156/90 Denies any headaches or dizziness.  No chest pains or shortness of breath.  Social history, family history and surgical history reviewed. No current outpatient medications on file prior to visit.   No current facility-administered medications on file prior to visit.     Allergies  Allergen Reactions  . Flomax [Tamsulosin Hcl] Hives    Social History   Socioeconomic History  . Marital status: Single    Spouse name: Not on file  . Number of children: Not on file  . Years of education: Not on file  . Highest education level: Not on file  Occupational History  . Not on file  Social Needs  . Financial resource strain: Not on file  . Food  insecurity:    Worry: Not on file    Inability: Not on file  . Transportation needs:    Medical: Not on file    Non-medical: Not on file  Tobacco Use  . Smoking status: Never Smoker  . Smokeless tobacco: Never Used  Substance and Sexual Activity  . Alcohol use: No  . Drug use: Not on file  . Sexual activity: Not on file  Lifestyle  . Physical activity:    Days per week: Not on file    Minutes per session: Not on file  . Stress: Not on file  Relationships  . Social connections:    Talks on phone: Not on file    Gets together: Not on file    Attends religious service: Not on file    Active member of club or organization: Not on file    Attends meetings of clubs or organizations: Not on file    Relationship status: Not on file  . Intimate partner violence:    Fear of current or ex partner: Not on file    Emotionally abused: Not on file    Physically abused: Not on file    Forced sexual activity: Not on file  Other Topics Concern  . Not on file  Social History Narrative  . Not on file    Family History  Problem Relation  Age of Onset  . Diabetes Father     Past Surgical History:  Procedure Laterality Date  . COLONOSCOPY    . CYSTOSCOPY WITH RETROGRADE PYELOGRAM, URETEROSCOPY AND STENT PLACEMENT Right 05/17/2015   Procedure: CYSTOSCOPY WITH RETROGRADE PYELOGRAM, URETEROSCOPY AND STENT PLACEMENT;  Surgeon: Cleon Gustin, MD;  Location: [facility];  Service: Urology;  Laterality: Right;  . HOLMIUM LASER APPLICATION Right 07/13/1750   Procedure: HOLMIUM LASER APPLICATION;  Surgeon: Cleon Gustin, MD;  Location: [facility];  Service: Urology;  Laterality: Right;  . TONSILLECTOMY      ROS: Review of Systems Neg except as above PHYSICAL EXAM: BP (!) 175/91   Pulse 74   Temp 98.5 F (36.9 C) (Oral)   Resp 16   Ht 5\' 8"  (1.727 m)   Wt 195 lb 3.2 oz (88.5 kg)   SpO2 96%   BMI 29.68 kg/m   Physical Exam  General appearance - alert, well appearing, and in no  distress Mental status - normal mood, behavior, speech, dress, motor activity, and thought processes Chest - clear to auscultation, no wheezes, rales or rhonchi, symmetric air entry Heart - normal rate, regular rhythm, normal S1, S2, no murmurs, rubs, clicks or gallops Neurological - cranial nerves II through XII intact, normal muscle tone, no tremors, strength 5/5, Romberg sign negative, normal gait and station.  Gross sensation intact in the extremities and feet.  ASSESSMENT AND PLAN: 1. Tingling in extremities Questionable etiology.  Will check B12, folate level and A1c.  If these are normal we can consider referring to neurology for further evaluation - Vitamin B12 - Folate  2. Essential hypertension Not at goal.  Increase amlodipine to 10 mg daily.  Encouraged low-salt diet. - amLODipine (NORVASC) 10 MG tablet; Take 1 tablet (10 mg total) by mouth daily.  Dispense: 30 tablet; Refill: 6  3. Diabetes mellitus screening - Hemoglobin A1c  Patient was given the opportunity to ask questions.  Patient verbalized understanding of the plan and was able to repeat key elements of the plan.   Orders Placed This Encounter  Procedures  . Hemoglobin A1c  . Vitamin B12  . Folate     Requested Prescriptions   Signed Prescriptions Disp Refills  . amLODipine (NORVASC) 10 MG tablet 30 tablet 6    Sig: Take 1 tablet (10 mg total) by mouth daily.    No follow-ups on file.  Karle Plumber, MD, FACP

## 2018-09-24 NOTE — Patient Instructions (Signed)
Increase amlodipine to 10 mg daily.  Check your blood pressure at least once a week.  The goal is 130/80 or lower.

## 2018-09-25 LAB — VITAMIN B12: Vitamin B-12: 987 pg/mL (ref 232–1245)

## 2018-09-25 LAB — HEMOGLOBIN A1C
Est. average glucose Bld gHb Est-mCnc: 226 mg/dL
Hgb A1c MFr Bld: 9.5 % — ABNORMAL HIGH (ref 4.8–5.6)

## 2018-09-25 LAB — FOLATE: FOLATE: 13.5 ng/mL (ref >3.0)

## 2018-10-02 ENCOUNTER — Telehealth: Payer: Self-pay

## 2018-10-02 NOTE — Telephone Encounter (Signed)
Contacted pt to go over lab results. Pt is aware. Pt states she will be going to [place] and she will come by Friday to pick up a copy of her labs

## 2019-01-04 HISTORY — PX: LIVER BIOPSY: SHX301

## 2019-01-21 ENCOUNTER — Other Ambulatory Visit: Payer: Self-pay | Admitting: Gastroenterology

## 2019-01-21 ENCOUNTER — Other Ambulatory Visit (HOSPITAL_COMMUNITY): Payer: Self-pay | Admitting: Gastroenterology

## 2019-01-21 DIAGNOSIS — R748 Abnormal levels of other serum enzymes: Secondary | ICD-10-CM

## 2019-01-29 ENCOUNTER — Other Ambulatory Visit: Payer: Self-pay | Admitting: Radiology

## 2019-01-30 ENCOUNTER — Other Ambulatory Visit: Payer: Self-pay | Admitting: Physician Assistant

## 2019-01-30 ENCOUNTER — Other Ambulatory Visit: Payer: Self-pay | Admitting: Radiology

## 2019-01-31 ENCOUNTER — Other Ambulatory Visit: Payer: Self-pay | Admitting: Radiology

## 2019-01-31 ENCOUNTER — Ambulatory Visit (HOSPITAL_COMMUNITY)
Admission: RE | Admit: 2019-01-31 | Discharge: 2019-01-31 | Disposition: A | Discharge status: Home / Self Care | Payer: 59 | Source: Ambulatory Visit | Attending: Gastroenterology | Admitting: Gastroenterology

## 2019-01-31 ENCOUNTER — Encounter (HOSPITAL_COMMUNITY): Payer: Self-pay

## 2019-01-31 DIAGNOSIS — Z833 Family history of diabetes mellitus: Secondary | ICD-10-CM | POA: Insufficient documentation

## 2019-01-31 DIAGNOSIS — K760 Fatty (change of) liver, not elsewhere classified: Secondary | ICD-10-CM | POA: Diagnosis not present

## 2019-01-31 DIAGNOSIS — R748 Abnormal levels of other serum enzymes: Secondary | ICD-10-CM | POA: Insufficient documentation

## 2019-01-31 DIAGNOSIS — Z7984 Long term (current) use of oral hypoglycemic drugs: Secondary | ICD-10-CM | POA: Insufficient documentation

## 2019-01-31 DIAGNOSIS — Z79899 Other long term (current) drug therapy: Secondary | ICD-10-CM | POA: Insufficient documentation

## 2019-01-31 LAB — CBC
HCT: 43.6 % (ref 36.0–46.0)
Hemoglobin: 14.5 g/dL (ref 12.0–15.0)
MCH: 30 pg (ref 26.0–34.0)
MCHC: 33.3 g/dL (ref 30.0–36.0)
MCV: 90.1 fL (ref 80.0–100.0)
Platelets: 318 10*3/uL (ref 150–400)
RBC: 4.84 MIL/uL (ref 3.87–5.11)
RDW: 11.8 % (ref 11.5–15.5)
WBC: 3.2 10*3/uL — ABNORMAL LOW (ref 4.0–10.5)
nRBC: 0 % (ref 0.0–0.2)

## 2019-01-31 LAB — PROTIME-INR
INR: 1 (ref 0.8–1.2)
Prothrombin Time: 12.9 seconds (ref 11.4–15.2)

## 2019-01-31 MED ORDER — HYDROCODONE-ACETAMINOPHEN 5-325 MG PO TABS: 1.0000 | ORAL_TABLET | ORAL | Status: DC | PRN

## 2019-01-31 MED ORDER — SODIUM CHLORIDE 0.9 % IV SOLN: INTRAVENOUS | Status: DC

## 2019-01-31 MED ORDER — SODIUM CHLORIDE 0.9 % IV SOLN
INTRAVENOUS | Status: AC | PRN
  Administered 2019-01-31: 10 mL/h via INTRAVENOUS

## 2019-01-31 MED ORDER — FENTANYL CITRATE (PF) 100 MCG/2ML IJ SOLN
INTRAMUSCULAR | Status: AC
  Filled 2019-01-31: qty 2

## 2019-01-31 MED ORDER — LIDOCAINE HCL (PF) 1 % IJ SOLN
INTRAMUSCULAR | Status: AC
  Filled 2019-01-31: qty 30

## 2019-01-31 MED ORDER — FENTANYL CITRATE (PF) 100 MCG/2ML IJ SOLN
INTRAMUSCULAR | Status: AC | PRN
  Administered 2019-01-31 (×2): 50 ug via INTRAVENOUS

## 2019-01-31 MED ORDER — MIDAZOLAM HCL 2 MG/2ML IJ SOLN
INTRAMUSCULAR | Status: AC
  Filled 2019-01-31: qty 2

## 2019-01-31 MED ORDER — MIDAZOLAM HCL 2 MG/2ML IJ SOLN
INTRAMUSCULAR | Status: AC | PRN
  Administered 2019-01-31 (×2): 1 mg via INTRAVENOUS

## 2019-01-31 NOTE — Consult Note (Signed)
Chief Complaint: Patient was seen in consultation today for liver core biopsy at the request of Sophia Higgins  Referring Physician(s): Sophia Higgins  Supervising Physician: Sophia Higgins  Patient Status: [facility]  History of Present Illness: Sophia Higgins is a 57 y.o. female   Elevated liver enzymes Elevated AMA level Elevated Ferritin Hepatic steatosis Referred to Dr Sophia Higgins Requesting liver core biopsy- R/O primary biliary cholangitis  Scheduled now for same  History reviewed. No pertinent past medical history.  Past Surgical History:  Procedure Laterality Date  . COLONOSCOPY    . CYSTOSCOPY WITH RETROGRADE PYELOGRAM, URETEROSCOPY AND STENT PLACEMENT Right 05/17/2015   Procedure: CYSTOSCOPY WITH RETROGRADE PYELOGRAM, URETEROSCOPY AND STENT PLACEMENT;  Surgeon: Cleon Gustin, MD;  Location: [facility];  Service: Urology;  Laterality: Right;  . HOLMIUM LASER APPLICATION Right 0/53/9767   Procedure: HOLMIUM LASER APPLICATION;  Surgeon: Cleon Gustin, MD;  Location: [facility];  Service: Urology;  Laterality: Right;  . TONSILLECTOMY      Allergies: Patient has no known allergies.  Medications: Prior to Admission medications   Medication Sig Start Date End Date Taking? Authorizing Provider  atorvastatin (LIPITOR) 20 MG tablet Take 20 mg by mouth daily.   Yes [provider]  lisinopril (PRINIVIL,ZESTRIL) 20 MG tablet Take 20 mg by mouth daily.   Yes [provider]  metFORMIN (GLUCOPHAGE-XR) 500 MG 24 hr tablet Take 1,000 mg by mouth 2 (two) times daily.   Yes [provider]  Multiple Vitamins-Minerals (MULTIVITAMIN WITH MINERALS) tablet Take 1 tablet by mouth daily.   Yes [provider]     Family History  Problem Relation Age of Onset  . Diabetes Father     Social History   Socioeconomic History  . Marital status: Single    Spouse name: Not on file  . Number of children: Not on file  . Years of  education: Not on file  . Highest education level: Not on file  Occupational History  . Not on file  Social Needs  . Financial resource strain: Not on file  . Food insecurity:    Worry: Not on file    Inability: Not on file  . Transportation needs:    Medical: Not on file    Non-medical: Not on file  Tobacco Use  . Smoking status: Never Smoker  . Smokeless tobacco: Never Used  Substance and Sexual Activity  . Alcohol use: No  . Drug use: Never  . Sexual activity: Not on file  Lifestyle  . Physical activity:    Days per week: Not on file    Minutes per session: Not on file  . Stress: Not on file  Relationships  . Social connections:    Talks on phone: Not on file    Gets together: Not on file    Attends religious service: Not on file    Active member of club or organization: Not on file    Attends meetings of clubs or organizations: Not on file    Relationship status: Not on file  Other Topics Concern  . Not on file  Social History Narrative  . Not on file    Review of Systems: A 12 point ROS discussed and pertinent positives are indicated in the HPI above.  All other systems are negative.  Review of Systems  Constitutional: Negative for activity change, appetite change, fatigue, fever and unexpected weight change.  Respiratory: Negative for cough and shortness of breath.   Cardiovascular: Negative for chest  pain.  Gastrointestinal: Negative for abdominal distention, abdominal pain, nausea and vomiting.  Musculoskeletal: Negative for back pain.  Neurological: Negative for weakness.  Psychiatric/Behavioral: Negative for behavioral problems and confusion.    Vital Signs: BP (!) 155/86   Pulse 84   Temp 97.8 F (36.6 C) (Oral)   Resp 16   Ht 5\' 8"  (1.727 m)   Wt 166 lb (75.3 kg)   SpO2 99%   BMI 25.24 kg/m   Physical Exam Vitals signs reviewed.  Constitutional:      Appearance: Normal appearance.  Cardiovascular:     Rate and Rhythm: Normal rate and  regular rhythm.     Heart sounds: Normal heart sounds.  Pulmonary:     Effort: Pulmonary effort is normal.     Breath sounds: Normal breath sounds.  Abdominal:     General: Bowel sounds are normal.     Palpations: Abdomen is soft.  Musculoskeletal: Normal range of motion.  Skin:    General: Skin is warm and dry.  Neurological:     Mental Status: She is alert and oriented to person, place, and time.  Psychiatric:        Mood and Affect: Mood normal.        Behavior: Behavior normal.        Thought Content: Thought content normal.        Judgment: Judgment normal.     Imaging: No results found.  Labs:  CBC: No results for input(s): WBC, HGB, HCT, PLT in the last 8760 hours.  COAGS: No results for input(s): INR, APTT in the last 8760 hours.  BMP: No results for input(s): NA, K, CL, CO2, GLUCOSE, BUN, CALCIUM, CREATININE, GFRNONAA, GFRAA in the last 8760 hours.  Invalid input(s): CMP  LIVER FUNCTION TESTS: No results for input(s): BILITOT, AST, ALT, ALKPHOS, PROT, ALBUMIN in the last 8760 hours.  TUMOR MARKERS: No results for input(s): AFPTM, CEA, CA199, CHROMGRNA in the last 8760 hours.  Assessment and Plan:  Elevated LFTs And AMA level Hepatic steatosis Scheduled for random liver biopsy R/O primary biliary cholangitis Risks and benefits of liver core biopsy was discussed with the patient and/or patient's family including, but not limited to bleeding, infection, damage to adjacent structures or low yield requiring additional tests.  All of the questions were answered and there is agreement to proceed. Consent signed and in chart.   Thank you for this interesting consult.  I greatly enjoyed meeting Sophia Higgins and look forward to participating in their care.  A copy of this report was sent to the requesting provider on this date.  Electronically Signed: Lavonia Drafts, PA-C 01/31/2019, 7:16 AM   I spent a total of  30 Minutes   in face to face in  clinical consultation, greater than 50% of which was counseling/coordinating care for liver core biopsy

## 2019-01-31 NOTE — Discharge Instructions (Addendum)
Liver Biopsy, Care After °These instructions give you information about how to care for yourself after your procedure. Your health care provider may also give you more specific instructions. If you have problems or questions, contact your health care provider. °What can I expect after the procedure? °After your procedure, it is common to have: °· Pain and soreness in the area where the biopsy was done. °· Bruising around the area where the biopsy was done. °· Sleepiness and fatigue for 1-2 days. °Follow these instructions at home: °Medicines °· Take over-the-counter and prescription medicines only as told by your health care provider. °· If you were prescribed an antibiotic medicine, take it as told by your health care provider. Do not stop taking the antibiotic even if you start to feel better. °· Do not take medicines such as aspirin and ibuprofen unless your health care provider tells you to take them. These medicines thin your blood and can increase the risk of bleeding. °· If you are taking prescription pain medicine, take actions to prevent or treat constipation. Your health care provider may recommend that you: °? Drink enough fluid to keep your urine pale yellow. °? Eat foods that are high in fiber, such as fresh fruits and vegetables, whole grains, and beans. °? Limit foods that are high in fat and processed sugars, such as fried or sweet foods. °? Take an over-the-counter or prescription medicine for constipation. °Incision care °· Follow instructions from your health care provider about how to take care of your incision. Make sure you: °? Wash your hands with soap and water before you change your bandage (dressing). If soap and water are not available, use hand sanitizer. °? Change your dressing as told by your health care provider. °? Leave stitches (sutures), skin glue, or adhesive strips in place. These skin closures may need to stay in place for 2 weeks or longer. If adhesive strip edges start to  loosen and curl up, you may trim the loose edges. Do not remove adhesive strips completely unless your health care provider tells you to do that. °· Check your incision area every day for signs of infection. Check for: °? Redness, swelling, or pain. °? Fluid or blood. °? Warmth. °? Pus or a bad smell. °· Do not take baths, swim, or use a hot tub until your health care provider says it is okay to do so. °Activity ° °· Rest at home for 1-2 days, or as directed by your health care provider. °? Avoid sitting for a long time without moving. Get up to take short walks every 1-2 hours. This is important to improve blood flow and breathing. Ask for help if you feel weak or unsteady. °· Return to your normal activities as told by your health care provider. Ask your health care provider what activities are safe for you. °· Do not drive or use heavy machinery while taking prescription pain medicine. °· Do not lift anything that is heavier than 10 lb (4.5 kg), or the limit that your health care provider tells you, until he or she says that it is safe. °· Do not play contact sports for 2 weeks after the procedure. °General instructions ° °· Do not drink alcohol in the first week after the procedure. °· Have someone stay with you for at least 24 hours after the procedure. °· It is your responsibility to obtain your test results. Ask your health care provider, or the department that is doing the test: °? When will my   results be ready? °? How will I get my results? °? What are my treatment options? °? What other tests do I need? °? What are my next steps? °· Keep all follow-up visits as told by your health care provider. This is important. °Contact a health care provider if: °· You have increased bleeding from an incision, resulting in more than a small spot of blood. °· You have redness, swelling, or increasing pain in any incisions. °· You notice a discharge or a bad smell coming from any of your incisions. °· You have a fever or  chills. °Get help right away if: °· You develop swelling, bloating, or pain in your abdomen. °· You become dizzy or faint. °· You develop a rash. °· You have nausea or you vomit. °· You faint, or you have shortness of breath or difficulty breathing. °· You develop chest pain. °· You have problems with your speech or vision. °· You have trouble with your balance or moving your arms or legs. °Summary °· After the liver biopsy, it is common to have pain, soreness, and bruising in the area, as well as sleepiness and fatigue. °· Take over-the-counter and prescription medicines only as told by your health care provider. °· Follow instructions from your health care provider about how to care for your incision. Check the incision area daily for signs of infection. °This information is not intended to replace advice given to you by your health care provider. Make sure you discuss any questions you have with your health care provider. °Document Released: 06/09/2005 Document Revised: 11/30/2017 Document Reviewed: 11/30/2017 °Elsevier Interactive Patient Education © 2019 [employer] °Moderate Conscious Sedation, Adult, Care After °These instructions provide you with information about caring for yourself after your procedure. Your health care provider may also give you more specific instructions. Your treatment has been planned according to current medical practices, but problems sometimes occur. Call your health care provider if you have any problems or questions after your procedure. °What can I expect after the procedure? °After your procedure, it is common: °· To feel sleepy for several hours. °· To feel clumsy and have poor balance for several hours. °· To have poor judgment for several hours. °· To vomit if you eat too soon. °Follow these instructions at home: °For at least 24 hours after the procedure: ° °· Do not: °? Participate in activities where you could fall or become injured. °? Drive. °? Use heavy  machinery. °? Drink alcohol. °? Take sleeping pills or medicines that cause drowsiness. °? Make important decisions or sign legal documents. °? Take care of children on your own. °· Rest. °Eating and drinking °· Follow the diet recommended by your health care provider. °· If you vomit: °? Drink water, juice, or soup when you can drink without vomiting. °? Make sure you have little or no nausea before eating solid foods. °General instructions °· Have a responsible adult stay with you until you are awake and alert. °· Take over-the-counter and prescription medicines only as told by your health care provider. °· If you smoke, do not smoke without supervision. °· Keep all follow-up visits as told by your health care provider. This is important. °Contact a health care provider if: °· You keep feeling nauseous or you keep vomiting. °· You feel light-headed. °· You develop a rash. °· You have a fever. °Get help right away if: °· You have trouble breathing. °This information is not intended to replace advice given to you by your   health care provider. Make sure you discuss any questions you have with your health care provider. °Document Released: 09/10/2013 Document Revised: 04/24/2016 Document Reviewed: 03/11/2016 °Elsevier Interactive Patient Education © 2019 [employer] ° °

## 2019-01-31 NOTE — Procedures (Signed)
  Procedure: US core liver biopsy  18g x3 EBL:   minimal Complications:  none immediate  See full dictation in [facility] PACS.  D. Jailan Trimm MD Main # [phone] Pager  [phone]    

## 2019-03-07 ENCOUNTER — Ambulatory Visit (HOSPITAL_COMMUNITY): Payer: Managed Care, Other (non HMO)

## 2019-05-09 ENCOUNTER — Ambulatory Visit
Admission: RE | Admit: 2019-05-09 | Discharge: 2019-05-09 | Disposition: A | Discharge status: Home / Self Care | Payer: 59 | Source: Ambulatory Visit | Attending: Family Medicine | Admitting: Family Medicine

## 2019-05-09 ENCOUNTER — Other Ambulatory Visit: Payer: Self-pay | Admitting: Family Medicine

## 2019-05-09 DIAGNOSIS — M549 Dorsalgia, unspecified: Secondary | ICD-10-CM

## 2019-05-09 DIAGNOSIS — R200 Anesthesia of skin: Secondary | ICD-10-CM

## 2019-05-09 DIAGNOSIS — R202 Paresthesia of skin: Secondary | ICD-10-CM

## 2019-06-10 ENCOUNTER — Other Ambulatory Visit: Payer: Self-pay | Admitting: *Deleted

## 2019-06-10 DIAGNOSIS — Z20822 Contact with and (suspected) exposure to covid-19: Secondary | ICD-10-CM

## 2019-06-16 LAB — NOVEL CORONAVIRUS, NAA: SARS-CoV-2, NAA: NOT DETECTED

## 2019-07-01 ENCOUNTER — Other Ambulatory Visit: Payer: Self-pay | Admitting: *Deleted

## 2019-07-01 DIAGNOSIS — Z20822 Contact with and (suspected) exposure to covid-19: Secondary | ICD-10-CM

## 2019-07-02 ENCOUNTER — Other Ambulatory Visit: Payer: Self-pay

## 2019-07-03 LAB — NOVEL CORONAVIRUS, NAA: SARS-CoV-2, NAA: NOT DETECTED

## 2019-08-19 IMAGING — US US BIOPSY CORE LIVER
1 series · 12 of 12 positions shown · non-contrast
Comparison: none

CLINICAL DATA: Elevated LFTs of uncertain etiology

EXAM:
ULTRASOUND-GUIDED CORE LIVER BIOPSY
TECHNIQUE: An ultrasound guided liver biopsy was thoroughly discussed with the
patient and questions were answered. The benefits, risks,
alternatives, and complications were also discussed. The patient
understands and wishes to proceed with the procedure. A verbal as
well as written consent was obtained.

[Series 1: us biopsy core liver · 12 of 12 slices shown]
[im 1/12]
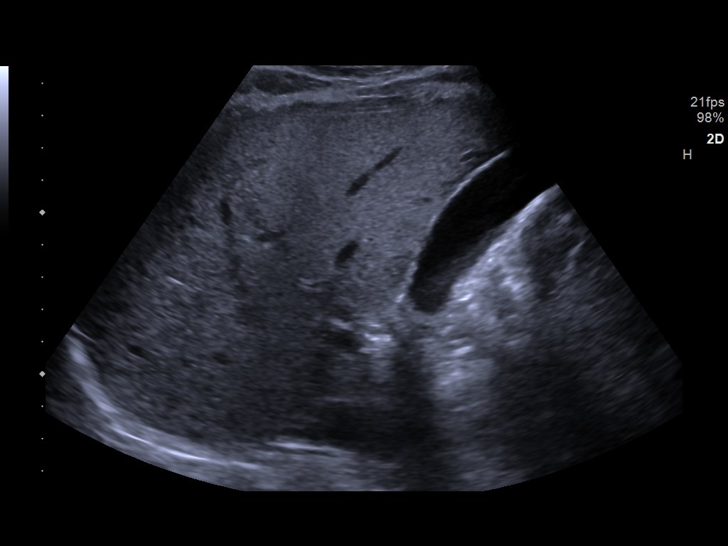
[im 2/12]
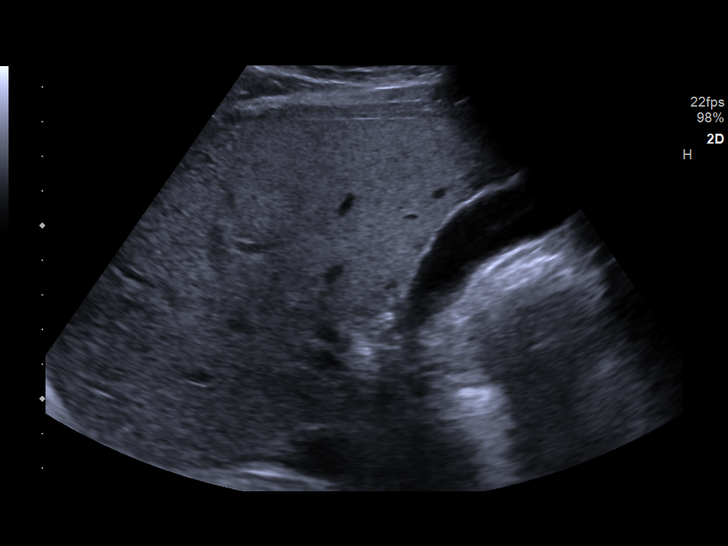
[im 3/12]
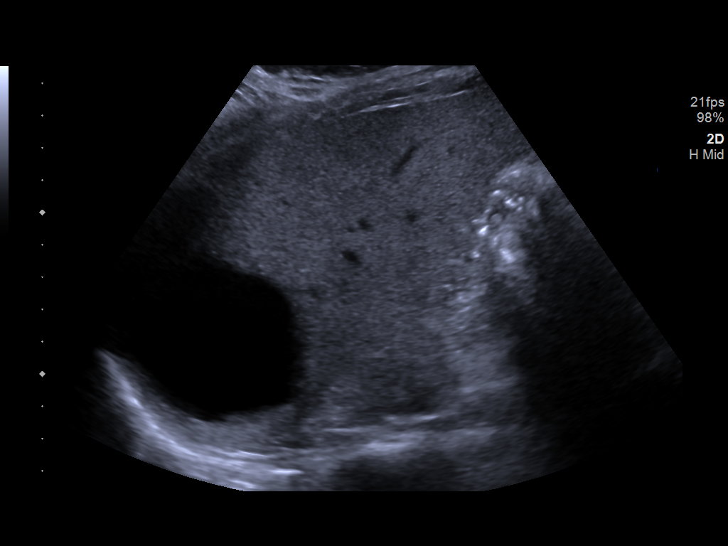
[im 4/12]
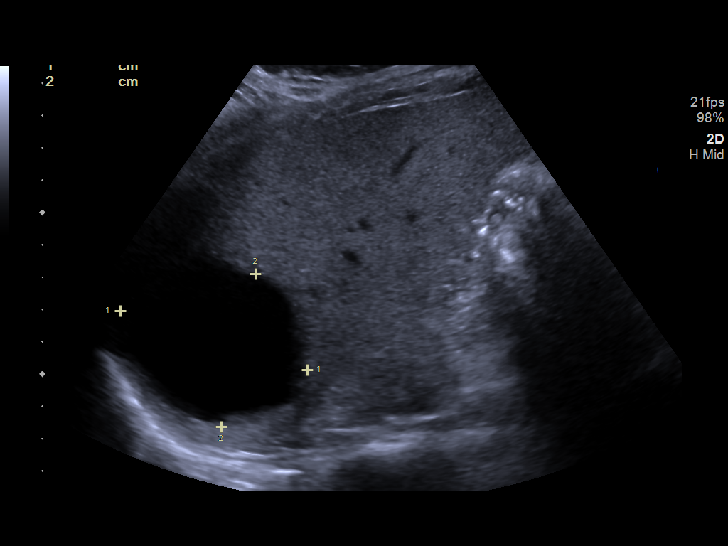
[im 5/12]
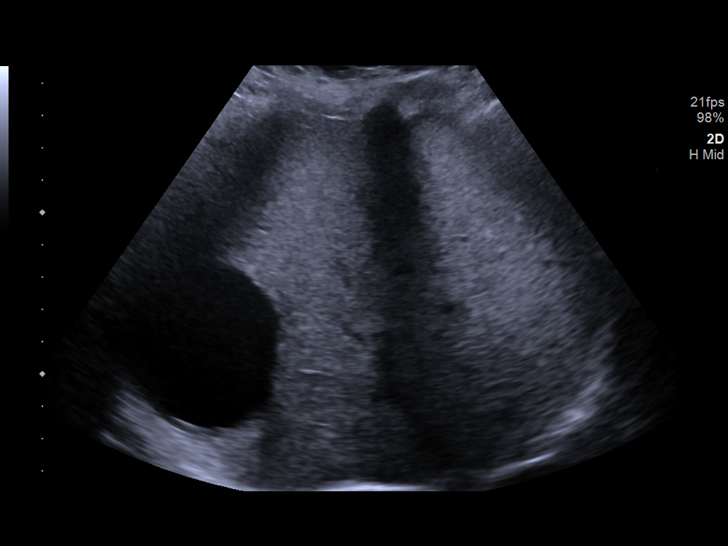
[im 6/12]
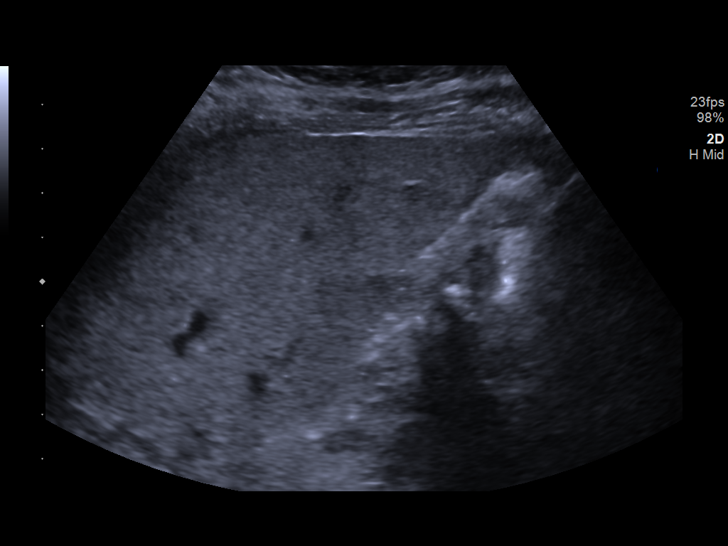
[im 7/12]
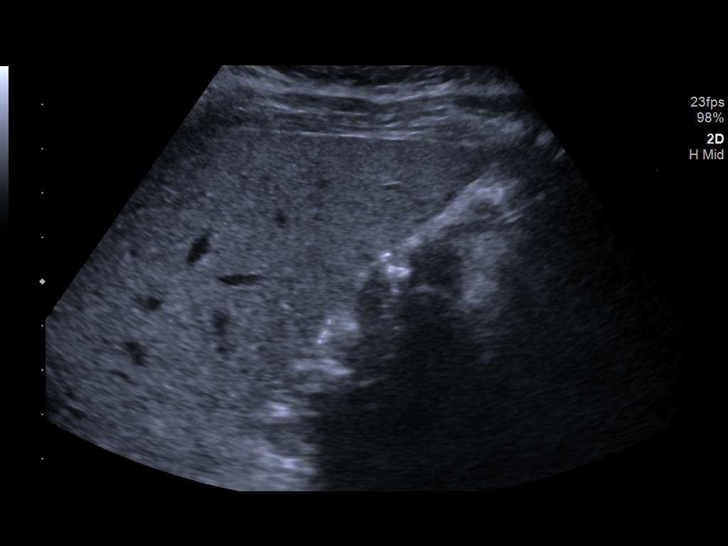
[im 8/12]
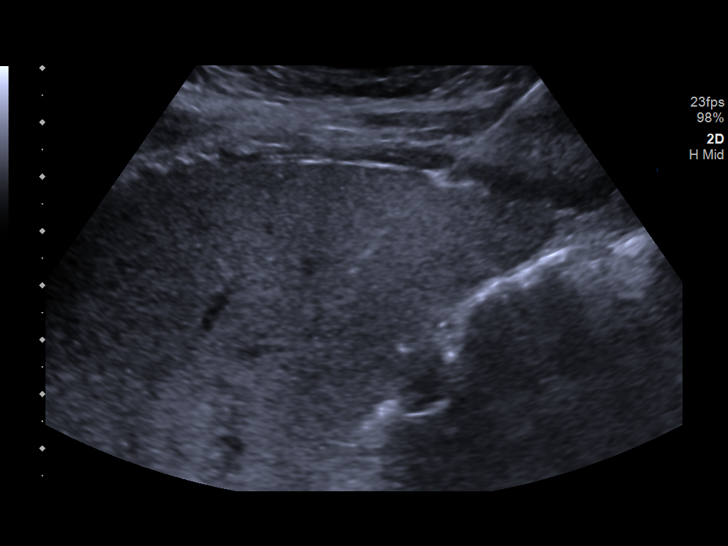
[im 9/12]
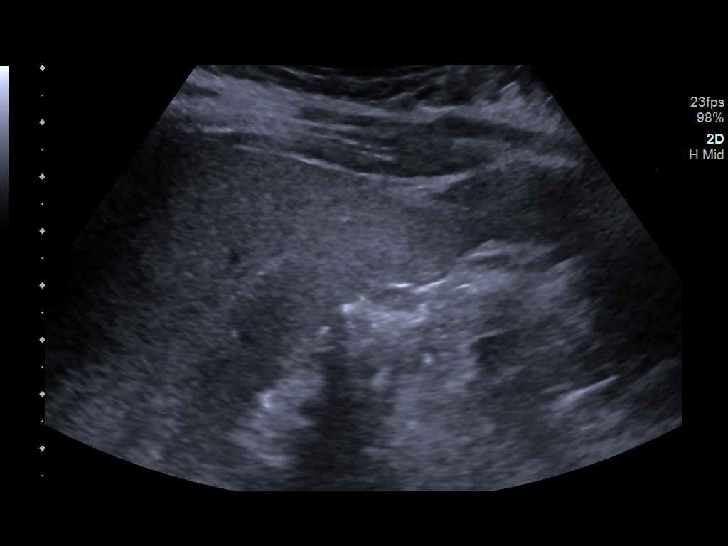
[im 10/12]
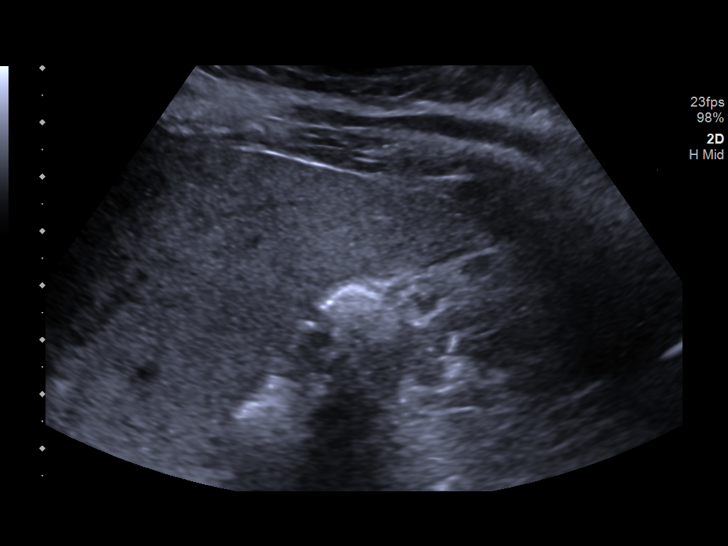
[im 11/12]
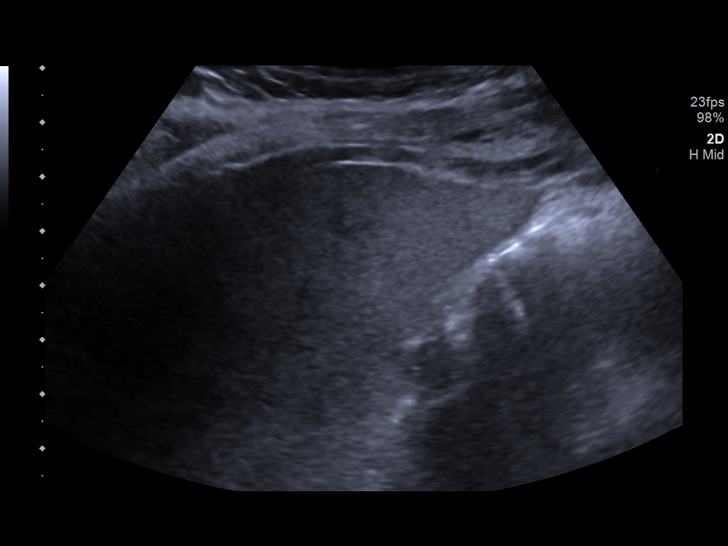
[im 12/12]
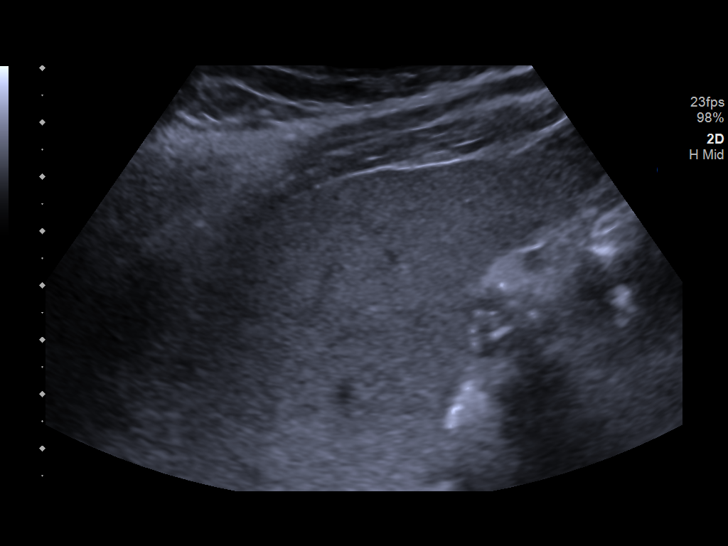

[12 of 12 positions shown; findings below may reference images not displayed]

Survey ultrasound of the liver was performed and an appropriate skin
entry site was determined. Skin site was marked, prepped with
Betadine, and draped in usual sterile fashion, and infiltrated
locally with 1% lidocaine. Intravenous Fentanyl and Versed were
administered as conscious sedation during continuous monitoring of
the patient's level of consciousness and physiological /
cardiorespiratory status by the radiology RN, with a total moderate
sedation time of 10 minutes.

A 17 gauge trocar needle was advanced under ultrasound guidance into
the liver. 3 coaxial 41gauge core samples were then obtained through
the guide needle. The guide needle was removed. Post procedure scans
demonstrate no apparent complication.

COMPLICATIONS:
COMPLICATIONS
None immediate
FINDINGS: Survey ultrasound of the liver demonstrates simple appearing 6.1 cm
cyst in segment 7. Otherwise unremarkable parenchyma on limited
interrogation. Representative core biopsy samples obtained as above
IMPRESSION: 1. Technically successful ultrasound guided core liver biopsy.

## 2019-09-22 ENCOUNTER — Encounter: Payer: Self-pay | Admitting: Neurology

## 2019-09-22 ENCOUNTER — Other Ambulatory Visit: Payer: Self-pay

## 2019-09-22 ENCOUNTER — Ambulatory Visit (INDEPENDENT_AMBULATORY_CARE_PROVIDER_SITE_OTHER): Payer: 59 | Admitting: Neurology

## 2019-09-22 VITALS — BP 160/81 | HR 70 | Temp 97.4°F | Ht 68.0 in | Wt 174.0 lb

## 2019-09-22 DIAGNOSIS — I10 Essential (primary) hypertension: Secondary | ICD-10-CM

## 2019-09-22 DIAGNOSIS — E119 Type 2 diabetes mellitus without complications: Secondary | ICD-10-CM | POA: Diagnosis not present

## 2019-09-22 DIAGNOSIS — R202 Paresthesia of skin: Secondary | ICD-10-CM

## 2019-09-22 MED ORDER — GABAPENTIN 300 MG PO CAPS: ORAL_CAPSULE | ORAL | 6 refills | Status: AC

## 2019-09-22 NOTE — Patient Instructions (Signed)
Start Gabapentin  Gabapentin capsules or tablets What is this medicine? GABAPENTIN (GA ba pen tin) is used to control seizures in certain types of epilepsy. It is also used to treat certain types of nerve pain. This medicine may be used for other purposes; ask your health care provider or pharmacist if you have questions. COMMON BRAND NAME(S): Active-PAC with Gabapentin, Gabarone, Neurontin What should I tell my health care provider before I take this medicine? They need to know if you have any of these conditions:  history of drug abuse or alcohol abuse problem  kidney disease  lung or breathing disease  suicidal thoughts, plans, or attempt; a previous suicide attempt by you or a family member  an unusual or allergic reaction to gabapentin, other medicines, foods, dyes, or preservatives  pregnant or trying to get pregnant  breast-feeding How should I use this medicine? Take this medicine by mouth with a glass of water. Follow the directions on the prescription label. You can take it with or without food. If it upsets your stomach, take it with food. Take your medicine at regular intervals. Do not take it more often than directed. Do not stop taking except on your doctor's advice. If you are directed to break the 600 or 800 mg tablets in half as part of your dose, the extra half tablet should be used for the next dose. If you have not used the extra half tablet within 28 days, it should be thrown away. A special MedGuide will be given to you by the pharmacist with each prescription and refill. Be sure to read this information carefully each time. Talk to your pediatrician regarding the use of this medicine in children. While this drug may be prescribed for children as young as 3 years for selected conditions, precautions do apply. Overdosage: If you think you have taken too much of this medicine contact a poison control center or emergency room at once. NOTE: This medicine is only for  you. Do not share this medicine with others. What if I miss a dose? If you miss a dose, take it as soon as you can. If it is almost time for your next dose, take only that dose. Do not take double or extra doses. What may interact with this medicine? This medicine may interact with the following medications:  alcohol  antihistamines for allergy, cough, and cold  certain medicines for anxiety or sleep  certain medicines for depression like amitriptyline, fluoxetine, sertraline  certain medicines for seizures like phenobarbital, primidone  certain medicines for stomach problems  general anesthetics like halothane, isoflurane, methoxyflurane, propofol  local anesthetics like lidocaine, pramoxine, tetracaine  medicines that relax muscles for surgery  narcotic medicines for pain  phenothiazines like chlorpromazine, mesoridazine, prochlorperazine, thioridazine This list may not describe all possible interactions. Give your health care provider a list of all the medicines, herbs, non-prescription drugs, or dietary supplements you use. Also tell them if you smoke, drink alcohol, or use illegal drugs. Some items may interact with your medicine. What should I watch for while using this medicine? Visit your doctor or health care provider for regular checks on your progress. You may want to keep a record at home of how you feel your condition is responding to treatment. You may want to share this information with your doctor or health care provider at each visit. You should contact your doctor or health care provider if your seizures get worse or if you have any new types of seizures. Do  not stop taking this medicine or any of your seizure medicines unless instructed by your doctor or health care provider. Stopping your medicine suddenly can increase your seizures or their severity. This medicine may cause serious skin reactions. They can happen weeks to months after starting the medicine. Contact  your health care provider right away if you notice fevers or flu-like symptoms with a rash. The rash may be red or purple and then turn into blisters or peeling of the skin. Or, you might notice a red rash with swelling of the face, lips or lymph nodes in your neck or under your arms. Wear a medical identification bracelet or chain if you are taking this medicine for seizures, and carry a card that lists all your medications. You may get drowsy, dizzy, or have blurred vision. Do not drive, use machinery, or do anything that needs mental alertness until you know how this medicine affects you. To reduce dizzy or fainting spells, do not sit or stand up quickly, especially if you are an older patient. Alcohol can increase drowsiness and dizziness. Avoid alcoholic drinks. Your mouth may get dry. Chewing sugarless gum or sucking hard candy, and drinking plenty of water will help. The use of this medicine may increase the chance of suicidal thoughts or actions. Pay special attention to how you are responding while on this medicine. Any worsening of mood, or thoughts of suicide or dying should be reported to your health care provider right away. Women who become pregnant while using this medicine may enroll in the [place] Pregnancy Registry by calling [phone]. This registry collects information about the safety of antiepileptic drug use during pregnancy. What side effects may I notice from receiving this medicine? Side effects that you should report to your doctor or health care professional as soon as possible:  allergic reactions like skin rash, itching or hives, swelling of the face, lips, or tongue  breathing problems  rash, fever, and swollen lymph nodes  redness, blistering, peeling or loosening of the skin, including inside the mouth  suicidal thoughts, mood changes Side effects that usually do not require medical attention (report to your doctor or health care  professional if they continue or are bothersome):  dizziness  drowsiness  headache  nausea, vomiting  swelling of ankles, feet, hands  tiredness This list may not describe all possible side effects. Call your doctor for medical advice about side effects. You may report side effects to FDA at 1-800-FDA-1088. Where should I keep my medicine? Keep out of reach of children. This medicine may cause accidental overdose and death if it taken by other adults, children, or pets. Mix any unused medicine with a substance like cat litter or coffee grounds. Then throw the medicine away in a sealed container like a sealed bag or a coffee can with a lid. Do not use the medicine after the expiration date. Store at room temperature between 15 and 30 degrees C (59 and 86 degrees F). NOTE: This sheet is a summary. It may not cover all possible information. If you have questions about this medicine, talk to your doctor, pharmacist, or health care provider.  2020 Elsevier/Gold Standard (2019-02-21 14:16:43)

## 2019-09-22 NOTE — Progress Notes (Signed)
[facility]    Provider:  Dr Jaynee Eagles Requesting Provider: Marda Stalker, PA-C Primary Care Provider:  Marda Stalker, PA-C  CC:  Tingling  HPI:  Sophia Higgins is a 58 y.o. female here as requested by Marda Stalker, PA-C for "multiple tingling/numbness". PMHx hepatic steatosis, hypertension, uncontrolled type 2 diabetes, paresthesias, elevated lipids. The tingling "comes and goes". It started through the middle of her back and her back is "hot" she has hot flashes. She has tingling in the arms and legs especially when she lays down at night, when she has hot flashes. It can be anywhere in the arms, legs, thorax, back and around her lower back going down her legs as well as arms. Also on one side of her head. It comes and goes, not painful but aggravating, improving bu sh ehas called [facility] in the past. Can last 1/2 a day, sometimes a few minutes. She walks 2-3 miles which helps. The face and head especially on the left is involved. No neck pain or radiating pain down the arms, radicular pain, no radicular low back symptoms. No weakness. No vision changes, no speech changes. The metformin may have caused the worsened hot flashes and she is not on metformin anymore bc her hga1c improved. Paresthesias started a year ago. Bothers her mostly at night. Unclear triggers. Happens daily improving. No other focal neurologic deficits, associated symptoms, inciting events or modifiable factors.  Reviewed notes, labs and imaging from outside physicians, which showed:  I reviewed [employer] notes.  Patient's last hemoglobin A1c 9.5 last spring and increased to 10.7.  She has multiple complaints of arm and leg tingling bilaterally over the last year but feels the "pins-and-needles" sensation, and hot flashes.  This stopped with stopping Metformin and then restarted with adding medicine back in.  She had joint pain and atorvastatin.  Back in June her A1c had come down  drastically to 5.2 and stop the Metformin.  Denies leg weakness, leg pain, loss of strength, bowel bladder incontinence, her diet is much healthier.  She also saw Ortho in the past about low back pressure, leg tingling, XR essentially normal, they did not feel MRI would be helpful, she has intermittent sensory changes mostly pins-and-needles into the calves bilaterally, upper arms.  No weakness.  Range of motion normal.  No swelling.  Sensations worsen when she is sitting too long.  However also feeling tingling across her face, mouth, no headache, vision changes, had normal iron, B12, TSH previously.  Other labs reviewed include CBC with differential May 05, 2019 which appeared unremarkable, CMP with BUN 20 and creatinine 1.06, B12 is 456, TSH 1.71,  Review of Systems: Patient complains of symptoms per HPI as well as the following symptoms: paresthesias, weight gain. Pertinent negatives and positives per HPI. All others negative.   Social History   Socioeconomic History  . Marital status: Single    Spouse name: Not on file  . Number of children: 1  . Years of education: Not on file  . Highest education level: High school graduate  Occupational History  . Not on file  Social Needs  . Financial resource strain: Not on file  . Food insecurity    Worry: Not on file    Inability: Not on file  . Transportation needs    Medical: Not on file    Non-medical: Not on file  Tobacco Use  . Smoking status: Never Smoker  . Smokeless tobacco: Never Used  Substance and Sexual Activity  .  Alcohol use: No  . Drug use: Never  . Sexual activity: Not on file  Lifestyle  . Physical activity    Days per week: Not on file    Minutes per session: Not on file  . Stress: Not on file  Relationships  . Social Herbalist on phone: Not on file    Gets together: Not on file    Attends religious service: Not on file    Active member of club or organization: Not on file    Attends meetings of clubs  or organizations: Not on file    Relationship status: Not on file  . Intimate partner violence    Fear of current or ex partner: Not on file    Emotionally abused: Not on file    Physically abused: Not on file    Forced sexual activity: Not on file  Other Topics Concern  . Not on file  Social History Narrative   Lives at home with her daughter   Caffeine: decaf coffee   Right handed    Family History  Problem Relation Age of Onset  . Diabetes Father   . Kidney disease Brother        ESRF, s/p transplant     Past Medical History:  Diagnosis Date  . Diabetes mellitus, type II ([place])   . Hypertension   . Kidney stone 2016   Dr. Alyson Ingles    Patient Active Problem List   Diagnosis Date Noted  . Hypertension   . Diabetes mellitus, type II [facility])     Past Surgical History:  Procedure Laterality Date  . COLONOSCOPY    . CYSTOSCOPY WITH RETROGRADE PYELOGRAM, URETEROSCOPY AND STENT PLACEMENT Right 05/17/2015   Procedure: CYSTOSCOPY WITH RETROGRADE PYELOGRAM, URETEROSCOPY AND STENT PLACEMENT;  Surgeon: Cleon Gustin, MD;  Location: [facility];  Service: Urology;  Laterality: Right;  . HOLMIUM LASER APPLICATION Right [id number]   Procedure: HOLMIUM LASER APPLICATION;  Surgeon: Cleon Gustin, MD;  Location: [facility];  Service: Urology;  Laterality: Right;  . LIVER BIOPSY  01/2019  . TONSILLECTOMY      Current Outpatient Medications  Medication Sig Dispense Refill  . ASPIRIN PO Take by mouth as needed.    Marland Kitchen atorvastatin (LIPITOR) 20 MG tablet Take 40 mg by mouth daily.     Marland Kitchen lisinopril (PRINIVIL,ZESTRIL) 20 MG tablet Take 20 mg by mouth daily.    . Multiple Vitamins-Minerals (MULTIVITAMIN WITH MINERALS) tablet Take 1 tablet by mouth daily.    Marland Kitchen gabapentin (NEURONTIN) 300 MG capsule Take 1-2 pills (300-600mg ) at bedtime. 60 capsule 6   No current facility-administered medications for this visit.     Allergies as of 09/22/2019  . (No Known Allergies)    Vitals: BP (!)  160/81 (BP Location: Right Arm, Patient Position: Sitting)   Pulse 70   Temp (!) 97.4 F (36.3 C) Comment: taken at front door  Ht 5\' 8"  (1.727 m)   Wt 174 lb (78.9 kg)   BMI 26.46 kg/m  Last Weight:  Wt Readings from Last 1 Encounters:  09/22/19 174 lb (78.9 kg)   Last Height:   Ht Readings from Last 1 Encounters:  09/22/19 5\' 8"  (1.727 m)     Physical exam: Exam: Gen: NAD, conversant, well nourised, obese, well groomed                     CV: RRR, no MRG. No Carotid Bruits. No peripheral edema, warm, nontender  Eyes: Conjunctivae clear without exudates or hemorrhage  Neuro: Detailed Neurologic Exam  Speech:    Speech is normal; fluent and spontaneous with normal comprehension.  Cognition:    The patient is oriented to person, place, and time;     recent and remote memory intact;     language fluent;     normal attention, concentration,     fund of knowledge Cranial Nerves:    The pupils are equal, round, and reactive to light. The fundi are normal and spontaneous venous pulsations are present. Visual fields are full to finger confrontation. Extraocular movements are intact. Trigeminal sensation is intact and the muscles of mastication are normal. The face is symmetric. The palate elevates in the midline. Hearing intact. Voice is normal. Shoulder shrug is normal. The tongue has normal motion without fasciculations.   Coordination:    Normal finger to nose and heel to shin. Normal rapid alternating movements.   Gait:    Heel-toe and tandem gait are normal.   Motor Observation:    No asymmetry, no atrophy, and no involuntary movements noted. Tone:    Normal muscle tone.    Posture:    Posture is normal. normal erect    Strength:    Strength is V/V in the upper and lower limbs.      Sensation: intact to LT     Reflex Exam:  DTR's:    Deep tendon reflexes in the upper and lower extremities are normal bilaterally.   Toes:    The toes are downgoing bilaterally.    Clonus:    Clonus is absent.    Assessment/Plan:  58 y.o. female here as requested by Marda Stalker, PA-C for "multiple tingling/numbness". PMHx hepatic steatosis, hypertension, uncontrolled type 2 diabetes, paresthesias, elevated lipids.   -Patient has uncontrolled diabetes hemoglobin A1c above 10.  This is a significant risk factor for numbness and tingling.  I recommend that patient follow with Marda Stalker for close management.  -Patient has reported in the past that her paresthesias stopped when stopping Metformin then restarted with adding the medication back in.  May consider changing medications. But she is not on metformin and she still has paresthesias however improving.  - Recommend MRI of the brain and possibly MRI cervical spine but patient declines at this time, she is welcome to email me if symptoms worsen or do not improve.  - Start Gabapentin   Meds ordered this encounter  Medications  . gabapentin (NEURONTIN) 300 MG capsule    Sig: Take 1-2 pills (300-600mg ) at bedtime.    Dispense:  60 capsule    Refill:  6    Cc: Marda Stalker, PA-C  Sarina Ill, MD  [facility] [address] [place], [place] [address]  Phone [phone] Fax [phone]

## 2020-12-10 ENCOUNTER — Other Ambulatory Visit: Payer: Self-pay | Admitting: Family Medicine

## 2020-12-10 DIAGNOSIS — M5442 Lumbago with sciatica, left side: Secondary | ICD-10-CM

## 2020-12-24 ENCOUNTER — Other Ambulatory Visit: Payer: Self-pay | Admitting: Family Medicine

## 2020-12-24 DIAGNOSIS — M5442 Lumbago with sciatica, left side: Secondary | ICD-10-CM

## 2021-01-13 ENCOUNTER — Encounter: Payer: Self-pay | Admitting: Neurology

## 2021-01-13 ENCOUNTER — Ambulatory Visit: Payer: 59 | Admitting: Neurology

## 2021-01-13 VITALS — BP 189/85 | HR 73 | Ht 68.5 in | Wt 192.0 lb

## 2021-01-13 DIAGNOSIS — R200 Anesthesia of skin: Secondary | ICD-10-CM

## 2021-01-13 DIAGNOSIS — R202 Paresthesia of skin: Secondary | ICD-10-CM

## 2021-01-13 NOTE — Progress Notes (Signed)
[facility]    Provider:  Dr Jaynee Eagles Requesting Provider: Marda Stalker, PA-C Primary Care Provider:  Marda Stalker, PA-C  CC:  Tingling, back pain  HPI 01/13/2021: Patient is a 60 year old female here at the request of Marda Stalker for a new chief complaint(back pain) and continuing old(tinging), patient states she is still having tingling and new back pain, it comes and goes, started through the middle of her back and her back is "hot", she has tingling in the arms and legs especially when she lays down at night, when she has hot flashes, it can be anywhere in the arms, legs, thorax, back and around the lower back going down her legs as well as arms, also on 1 side of her head, it comes and goes, not painful but aggravating, can last half a day sometimes a few minutes, walking does not seem to aggravate symptoms, no neck pain but does have sensations down her arms, no weakness but bilateral tingling pain, no vision changes, no speech changes no other focal neurologic deficits associated symptoms inciting events or modifiable factors.  Tingling happens with hot flashes, no joint pain, range of motion normal, CMP with BUN 20, creatinine 1.06, B12 is 456, TSH is 1.71, she declined MRI by both Ortho and neuro last fall, she did try at home exercises, anti-inflammatories without change in symptoms, in the past she has had A1c of 9.5% spring 2019 and increased to 10.7% when checked, she started Metformin and since then she has been very well controlled with A1c range in the 5 to 6% range.  She reports she still has paresthesias, back pain and stiffness, a lot of symptoms have improved, she started having pain in the right shoulder and arm, she is getting MRi of the cervical spine and lumbar spine tomorrow. A steroid for a week helped. Tingling will come and go, will come again and then go away for 5-6 months. Her insurance is better. Her right arm is better. She has a lot of  back issues. No headaches. No changes in bowel/bladder, No other focal neurologic deficits, associated symptoms, inciting events or modifiable factors.  Cbc nml 01/31/2019, B12 2019 987, recent HGBA1c controlled but in 09/2018 9.5.   MRI cervical spine and lumbar spine 01/2021: personally reviewed images and agree with the following:  T12-L1: Negative.  L1-2: Disc bulging mildly eccentric to the right without stenosis.  L2-3: Disc bulging without stenosis.  L3-4: Disc bulging without stenosis.  L4-5: Disc bulging without stenosis.  L5-S1: Disc bulging mildly eccentric to the left results in mild left neural foraminal stenosis without spinal stenosis.  IMPRESSION: 1. Mild diffuse lumbar disc degeneration without spinal stenosis. 2. Mild left neural foraminal stenosis at L5-S1.   C2-3: Negative.  C3-4: Minimal disc bulging and left uncovertebral spurring without significant stenosis.  C4-5: Minimal disc bulging without stenosis.  C5-6: Disc bulging and uncovertebral spurring result in borderline left neural foraminal stenosis without spinal stenosis.  C6-7: Disc bulging and uncovertebral spurring result in borderline spinal stenosis without neural foraminal stenosis.  C7-T1: Negative.  IMPRESSION: Cervical disc degeneration primarily at C5-6 and C6-7. No evidence of neural impingement.   HPI 09/22/2019:  VANDANA HAMAN is a 60 y.o. female here as requested by Marda Stalker, PA-C for "multiple tingling/numbness". PMHx hepatic steatosis, hypertension, uncontrolled type 2 diabetes, paresthesias, elevated lipids. The tingling "comes and goes". It started through the middle of her back and her back is "hot" she has hot flashes. She has tingling  in the arms and legs especially when she lays down at night, when she has hot flashes. It can be anywhere in the arms, legs, thorax, back and around her lower back going down her legs as well as arms. Also on one side of  her head. It comes and goes, not painful but aggravating, improving bu sh ehas called [facility] in the past. Can last 1/2 a day, sometimes a few minutes. She walks 2-3 miles which helps. The face and head especially on the left is involved. No neck pain or radiating pain down the arms, radicular pain, no radicular low back symptoms. No weakness. No vision changes, no speech changes. The metformin may have caused the worsened hot flashes and she is not on metformin anymore bc her hga1c improved. Paresthesias started a year ago. Bothers her mostly at night. Unclear triggers. Happens daily improving. No other focal neurologic deficits, associated symptoms, inciting events or modifiable factors.  Reviewed notes, labs and imaging from outside physicians, which showed:  I reviewed [employer] notes.  Patient's last hemoglobin A1c 9.5 last spring and increased to 10.7.  She has multiple complaints of arm and leg tingling bilaterally over the last year but feels the "pins-and-needles" sensation, and hot flashes.  This stopped with stopping Metformin and then restarted with adding medicine back in.  She had joint pain and atorvastatin.  Back in June her A1c had come down drastically to 5.2 and stop the Metformin.  Denies leg weakness, leg pain, loss of strength, bowel bladder incontinence, her diet is much healthier.  She also saw Ortho in the past about low back pressure, leg tingling, XR essentially normal, they did not feel MRI would be helpful, she has intermittent sensory changes mostly pins-and-needles into the calves bilaterally, upper arms.  No weakness.  Range of motion normal.  No swelling.  Sensations worsen when she is sitting too long.  However also feeling tingling across her face, mouth, no headache, vision changes, had normal iron, B12, TSH previously.  Other labs reviewed include CBC with differential May 05, 2019 which appeared unremarkable, CMP with BUN 20 and creatinine 1.06, B12 is 456, TSH  1.71,  Patient complains of symptoms per HPI as well as the following symptoms: low back pain, tingling . Pertinent negatives and positives per HPI. All others negative     Social History   Socioeconomic History  . Marital status: Single    Spouse name: Not on file  . Number of children: 1  . Years of education: Not on file  . Highest education level: High school graduate  Occupational History  . Not on file  Tobacco Use  . Smoking status: Never Smoker  . Smokeless tobacco: Never Used  Vaping Use  . Vaping Use: Never used  Substance and Sexual Activity  . Alcohol use: No  . Drug use: Never  . Sexual activity: Not on file  Other Topics Concern  . Not on file  Social History Narrative   Lives at home with her daughter   Caffeine: decaf coffee   Right handed   Social Determinants of Health   Financial Resource Strain: Not on file  Food Insecurity: Not on file  Transportation Needs: Not on file  Physical Activity: Not on file  Stress: Not on file  Social Connections: Not on file  Intimate Partner Violence: Not on file    Family History  Problem Relation Age of Onset  . Diabetes Father   . Kidney disease Brother  ESRF, s/p transplant     Past Medical History:  Diagnosis Date  . Diabetes mellitus, type II ([place])   . Hypertension   . Kidney stone 2016   Dr. Alyson Ingles    Patient Active Problem List   Diagnosis Date Noted  . Hypertension   . Diabetes mellitus, type II [facility])     Past Surgical History:  Procedure Laterality Date  . COLONOSCOPY    . CYSTOSCOPY WITH RETROGRADE PYELOGRAM, URETEROSCOPY AND STENT PLACEMENT Right 05/17/2015   Procedure: CYSTOSCOPY WITH RETROGRADE PYELOGRAM, URETEROSCOPY AND STENT PLACEMENT;  Surgeon: Cleon Gustin, MD;  Location: [facility];  Service: Urology;  Laterality: Right;  . HOLMIUM LASER APPLICATION Right 1/85/6314   Procedure: HOLMIUM LASER APPLICATION;  Surgeon: Cleon Gustin, MD;  Location: [facility];  Service:  Urology;  Laterality: Right;  . LIVER BIOPSY  01/2019  . TONSILLECTOMY      Current Outpatient Medications  Medication Sig Dispense Refill  . ASPIRIN PO Take by mouth as needed.    Marland Kitchen atorvastatin (LIPITOR) 20 MG tablet Take 40 mg by mouth daily.     Marland Kitchen gabapentin (NEURONTIN) 300 MG capsule Take 1-2 pills (300-600mg ) at bedtime. 60 capsule 6  . lisinopril (PRINIVIL,ZESTRIL) 20 MG tablet Take 20 mg by mouth daily.    . metFORMIN (GLUCOPHAGE) 500 MG tablet Take 500 mg by mouth 2 (two) times daily with a meal.    . Multiple Vitamins-Minerals (MULTIVITAMIN WITH MINERALS) tablet Take 1 tablet by mouth daily.     No current facility-administered medications for this visit.    Allergies as of 01/13/2021  . (No Known Allergies)    Vitals: BP (!) 189/85 (BP Location: Right Arm, Patient Position: Sitting)   Pulse 73   Ht 5' 8.5" (1.74 m)   Wt 192 lb (87.1 kg) Comment: pt reports 184 lb according to other MD office yesterday  BMI 28.77 kg/m  Last Weight:  Wt Readings from Last 1 Encounters:  01/13/21 192 lb (87.1 kg)   Last Height:   Ht Readings from Last 1 Encounters:  01/13/21 5' 8.5" (1.74 m)    Physical exam: Exam: Gen: NAD, conversant, well nourised, obese, well groomed                     CV: RRR, no MRG. No Carotid Bruits. No peripheral edema, warm, nontender Eyes: Conjunctivae clear without exudates or hemorrhage  Neuro: Detailed Neurologic Exam  Speech:    Speech is normal; fluent and spontaneous with normal comprehension.  Cognition:    The patient is oriented to person, place, and time;     recent and remote memory intact;     language fluent;     normal attention, concentration,     fund of knowledge Cranial Nerves:    The pupils are equal, round, and reactive to light. The fundi are normal and spontaneous venous pulsations are present. Visual fields are full to finger confrontation. Extraocular movements are intact. Trigeminal sensation is intact and the muscles  of mastication are normal. The face is symmetric. The palate elevates in the midline. Hearing intact. Voice is normal. Shoulder shrug is normal. The tongue has normal motion without fasciculations.   Coordination:    No dysmetria or ataxia  Gait:   Normal native gait  Motor Observation:    No asymmetry, no atrophy, and no involuntary movements noted. Tone:    Normal muscle tone.    Posture:    Posture is normal. normal erect  Strength:    Strength is V/V in the upper and lower limbs.      Sensation: intact to LT     Reflex Exam:  DTR's:    Deep tendon reflexes in the upper and lower extremities are normal bilaterally.   Toes:    The toes are downgoing bilaterally.   Clonus:    Clonus is absent.   Assessment/Plan:  60 y.o. female here as requested by Marda Stalker, PA-C for tingling, low back pain. PMHx hepatic steatosis, hypertension, uncontrolled type 2 diabetes, paresthesias, elevated lipids.   -Patient had uncontrolled diabetes hemoglobin A1c above 10.  This is a significant risk factor for numbness and tingling. She feels better as far as tingling and hasnow controlled her HgbA1c (5-6).   -MRI cervical spine showed arthritic changes but no significant central or formaminal stenoses. MR lumbar spine mild foraminal narrowing left l5/s1 otherwise mild degenerative changes not causing significant central or foraminal stenosis.  - Recommend MRI of the brain: MRI brain due to concerning symptoms of parestesias in the limb, had facial numbness.  to look for space occupying mass, chiari or intracranial hypertension (pseudotumor), Multiple Sclerosis, stroke or other. MRI c-spine negative for causes.   Orders Placed This Encounter  Procedures  . MR BRAIN W WO CONTRAST    No orders of the defined types were placed in this encounter.   Cc: Marda Stalker, PA-C  Sarina Ill, MD  [facility] [address] [place], [place]  [address]  Phone [phone] Fax [phone]

## 2021-01-14 ENCOUNTER — Other Ambulatory Visit: Payer: Self-pay

## 2021-01-14 ENCOUNTER — Ambulatory Visit
Admission: RE | Admit: 2021-01-14 | Discharge: 2021-01-14 | Disposition: A | Discharge status: Home / Self Care | Payer: 59 | Source: Ambulatory Visit | Attending: Family Medicine | Admitting: Family Medicine

## 2021-01-14 ENCOUNTER — Encounter: Payer: Self-pay | Admitting: Neurology

## 2021-01-14 DIAGNOSIS — M5442 Lumbago with sciatica, left side: Secondary | ICD-10-CM

## 2021-01-17 ENCOUNTER — Telehealth: Payer: Self-pay | Admitting: Neurology

## 2021-01-17 NOTE — Telephone Encounter (Signed)
[employer] auth: NPR via [employer]c website order sent to GI. They will reach out to the patient to schedule.  °

## 2021-03-07 ENCOUNTER — Other Ambulatory Visit: Payer: 59

## 2021-03-16 ENCOUNTER — Other Ambulatory Visit: Payer: Self-pay | Admitting: Obstetrics and Gynecology

## 2021-03-16 DIAGNOSIS — R928 Other abnormal and inconclusive findings on diagnostic imaging of breast: Secondary | ICD-10-CM

## 2021-03-22 ENCOUNTER — Other Ambulatory Visit: Payer: 59

## 2021-04-06 ENCOUNTER — Ambulatory Visit: Payer: 59

## 2021-04-06 ENCOUNTER — Ambulatory Visit
Admission: RE | Admit: 2021-04-06 | Discharge: 2021-04-06 | Disposition: A | Discharge status: Home / Self Care | Payer: 59 | Source: Ambulatory Visit | Attending: Obstetrics and Gynecology | Admitting: Obstetrics and Gynecology

## 2021-04-06 ENCOUNTER — Other Ambulatory Visit: Payer: Self-pay

## 2021-04-06 DIAGNOSIS — R928 Other abnormal and inconclusive findings on diagnostic imaging of breast: Secondary | ICD-10-CM

## 2021-05-01 ENCOUNTER — Ambulatory Visit
Admission: RE | Admit: 2021-05-01 | Discharge: 2021-05-01 | Disposition: A | Discharge status: Home / Self Care | Payer: 59 | Source: Ambulatory Visit | Attending: Neurology | Admitting: Neurology

## 2021-05-01 ENCOUNTER — Other Ambulatory Visit: Payer: Self-pay

## 2021-05-01 DIAGNOSIS — R202 Paresthesia of skin: Secondary | ICD-10-CM | POA: Diagnosis not present

## 2021-05-01 DIAGNOSIS — R200 Anesthesia of skin: Secondary | ICD-10-CM

## 2021-05-01 MED ORDER — GADOBENATE DIMEGLUMINE 529 MG/ML IV SOLN
17.0000 mL | Freq: Once | INTRAVENOUS | Status: AC | PRN
  Administered 2021-05-01: 17 mL via INTRAVENOUS

## 2021-05-04 ENCOUNTER — Other Ambulatory Visit: Payer: 59

## 2021-05-04 ENCOUNTER — Telehealth: Payer: Self-pay | Admitting: *Deleted

## 2021-05-04 NOTE — Telephone Encounter (Signed)
-----   Message from Melvenia Beam, MD sent at 05/02/2021  9:43 AM EDT ----- MRI of the brain unremarkable, normal for age

## 2021-05-04 NOTE — Telephone Encounter (Signed)
I called the pt and LVM (ok per DPR) advising her MRI brain is unremarkable, normal for age, no concerns. I left the office number for patient to call back if she has any questions.

## 2021-05-30 ENCOUNTER — Ambulatory Visit (INDEPENDENT_AMBULATORY_CARE_PROVIDER_SITE_OTHER): Payer: 59

## 2021-05-30 ENCOUNTER — Encounter: Payer: Self-pay | Admitting: Podiatry

## 2021-05-30 ENCOUNTER — Other Ambulatory Visit: Payer: Self-pay

## 2021-05-30 ENCOUNTER — Ambulatory Visit: Payer: 59 | Admitting: Podiatry

## 2021-05-30 DIAGNOSIS — M79672 Pain in left foot: Secondary | ICD-10-CM | POA: Diagnosis not present

## 2021-05-30 DIAGNOSIS — M205X2 Other deformities of toe(s) (acquired), left foot: Secondary | ICD-10-CM

## 2021-05-30 DIAGNOSIS — M778 Other enthesopathies, not elsewhere classified: Secondary | ICD-10-CM | POA: Diagnosis not present

## 2021-05-30 MED ORDER — TRIAMCINOLONE ACETONIDE 10 MG/ML IJ SUSP
10.0000 mg | Freq: Once | INTRAMUSCULAR | Status: AC
  Administered 2021-05-30: 10 mg

## 2021-05-30 NOTE — Progress Notes (Signed)
Subjective:   Patient ID: Sophia Higgins, female   DOB: 60 y.o.   MRN: [id number]   HPI Patient states that she has had increased discomfort in the left big toe joint with inflammation fluid spur formation reduced motion and admits she should have been here quite a bit earlier than the 5-year since I seen her last.  States that the medication really did help her but the pain is becoming more consistent.  Patient does not smoke likes to be active   Review of Systems  All other systems reviewed and are negative.      Objective:  Physical Exam Vitals and nursing note reviewed.  Constitutional:      Appearance: She is well-developed.  Pulmonary:     Effort: Pulmonary effort is normal.  Musculoskeletal:        General: Normal range of motion.  Skin:    General: Skin is warm.  Neurological:     Mental Status: She is alert.    Neurovascular status found to be intact muscle strength found to be adequate range of motion adequate with patient noted to have reduced range of motion first MPJ left but still present with no crepitus of the joint.  Right has structural bunion and mild reduction of motion not as significant of the left.  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  Patient wants to get this done want surgery and at this point I explained a biplanar osteotomy removal of spur and the fact that ultimately may require fusion or implant procedure.  Patient wants surgery understanding risk and at this point patient signed consent form after extensive review understanding total recovery will be approximately 6 months and there is no long-term guarantees as to what we will be able to accomplish.  She is dispensed air fracture walker for the postoperative  period and I also today since she cannot do the procedure for several months and she is going on vacation I did do sterile prep and injected periarticular around the first MPJ 3 mg Kenalog 5 mg Xylocaine for temporary correction.  Patient encouraged to call questions concerns prior to procedure to be scheduled for the beginning of August  X-rays indicate spurring around the first metatarsal head left moderate narrowness of the surface still rounded at this time

## 2021-07-08 ENCOUNTER — Telehealth: Payer: Self-pay | Admitting: Urology

## 2021-07-08 NOTE — Telephone Encounter (Signed)
DOS - 07/19/21  [place] BUNIONECTOMY LEFT --- [phone]  [facility] EFFECTIVE DATE -  12/04/20  PLAN DEDUCTIBLE -  $500.00 W/ $500.00 REMAINING OUT OF POCKET - $3,000.00 W/ $2,830.00 REMAINING COINSURANCE - 15% COPAY - $0.00   PER [facility] SITE CPT CODE 65784 HAS BEEN APPROVED, AUTH # [id number]

## 2021-07-11 ENCOUNTER — Ambulatory Visit: Payer: 59 | Admitting: Cardiology

## 2021-07-18 ENCOUNTER — Other Ambulatory Visit: Payer: Self-pay | Admitting: Podiatry

## 2021-07-18 MED ORDER — ONDANSETRON HCL 4 MG PO TABS: 4.0000 mg | ORAL_TABLET | Freq: Three times a day (TID) | ORAL | 0 refills | Status: AC | PRN

## 2021-07-18 MED ORDER — OXYCODONE-ACETAMINOPHEN 10-325 MG PO TABS: 1.0000 | ORAL_TABLET | ORAL | 0 refills | Status: DC | PRN

## 2021-07-18 NOTE — Addendum Note (Signed)
Addended by: Wallene Huh on: 07/18/2021 01:26 PM   Modules accepted: Orders

## 2021-07-19 ENCOUNTER — Encounter: Payer: Self-pay | Admitting: Podiatry

## 2021-07-19 ENCOUNTER — Telehealth: Payer: Self-pay | Admitting: *Deleted

## 2021-07-19 DIAGNOSIS — M2012 Hallux valgus (acquired), left foot: Secondary | ICD-10-CM | POA: Diagnosis not present

## 2021-07-19 DIAGNOSIS — M2022 Hallux rigidus, left foot: Secondary | ICD-10-CM | POA: Diagnosis not present

## 2021-07-19 MED ORDER — OXYCODONE-ACETAMINOPHEN 5-325 MG PO TABS: 1.0000 | ORAL_TABLET | ORAL | 0 refills | Status: AC | PRN

## 2021-07-19 NOTE — Telephone Encounter (Signed)
[facility] is requesting that the pain medicine (ocycodone-ace) sent be modified or changed , dosing is too high to fill. Please advised

## 2021-07-19 NOTE — Telephone Encounter (Signed)
Called patient, medication sent.

## 2021-07-19 NOTE — Telephone Encounter (Signed)
Please advise 

## 2021-07-25 ENCOUNTER — Encounter: Payer: Self-pay | Admitting: Podiatry

## 2021-07-25 ENCOUNTER — Other Ambulatory Visit: Payer: Self-pay

## 2021-07-25 ENCOUNTER — Ambulatory Visit (INDEPENDENT_AMBULATORY_CARE_PROVIDER_SITE_OTHER): Payer: 59

## 2021-07-25 ENCOUNTER — Ambulatory Visit (INDEPENDENT_AMBULATORY_CARE_PROVIDER_SITE_OTHER): Payer: 59 | Admitting: Podiatry

## 2021-07-25 DIAGNOSIS — M205X2 Other deformities of toe(s) (acquired), left foot: Secondary | ICD-10-CM

## 2021-07-25 DIAGNOSIS — Z9889 Other specified postprocedural states: Secondary | ICD-10-CM

## 2021-07-25 DIAGNOSIS — M2012 Hallux valgus (acquired), left foot: Secondary | ICD-10-CM

## 2021-07-26 NOTE — Progress Notes (Signed)
Subjective:   Patient ID: Sophia Higgins, female   DOB: 60 y.o.   MRN: [id number]   HPI Patient presents stating she is doing very well with minimal discomfort and is walking in her boot with minimal pain   ROS      Objective:  Physical Exam  Neurovascular status intact negative Bevelyn Buckles' sign noted left foot healing well wound edges well coapted hallux in rectus position good range of motion     Assessment:  Doing well post osteotomy first metatarsal left     Plan:  H&P reviewed condition and x-ray and applied compression dressing continue elevation compression immobilization reappoint 3 weeks or earlier if any issues were to occur and encouraged range of motion activities  X-rays indicate osteotomies healing well fixation in place joint congruence

## 2021-08-15 ENCOUNTER — Telehealth: Payer: Self-pay

## 2021-08-15 ENCOUNTER — Encounter: Payer: Self-pay | Admitting: Podiatry

## 2021-08-15 ENCOUNTER — Ambulatory Visit (INDEPENDENT_AMBULATORY_CARE_PROVIDER_SITE_OTHER): Payer: 59

## 2021-08-15 ENCOUNTER — Ambulatory Visit (INDEPENDENT_AMBULATORY_CARE_PROVIDER_SITE_OTHER): Payer: 59 | Admitting: Podiatry

## 2021-08-15 ENCOUNTER — Other Ambulatory Visit: Payer: Self-pay

## 2021-08-15 DIAGNOSIS — M205X2 Other deformities of toe(s) (acquired), left foot: Secondary | ICD-10-CM

## 2021-08-15 DIAGNOSIS — Z9889 Other specified postprocedural states: Secondary | ICD-10-CM

## 2021-08-15 NOTE — Telephone Encounter (Signed)
Orders and demographics faxed to [facility]  Evaluate and treat hallux rigidus post op Twice weekly for 3 weeks Increase ROM

## 2021-08-15 NOTE — Progress Notes (Signed)
Subjective:   Patient ID: Sophia Higgins, female   DOB: 60 y.o.   MRN: [id number]   HPI Patient states doing well and I am excited to start doing more activities   ROS      Objective:  Physical Exam  Neurovascular status intact negative Bevelyn Buckles' sign noted wound edges well coapted range of motion adequate but somewhat restricted after having hallux limitus condition with no crepitus     Assessment:  Doing well overall does have moderate limitation of motion     Plan:  Reviewed condition and recommend physical therapy to improve motion and also continue with immobilization with gradual shoe gear usage over the next few weeks.  Dispense compression stocking and reappoint to recheck  X-rays indicate osteotomies healing well fixation in place joint congruence

## 2021-08-23 ENCOUNTER — Ambulatory Visit: Payer: 59 | Admitting: Cardiovascular Disease

## 2021-08-23 ENCOUNTER — Encounter: Payer: Self-pay | Admitting: Cardiovascular Disease

## 2021-08-23 ENCOUNTER — Other Ambulatory Visit: Payer: Self-pay

## 2021-08-23 VITALS — BP 156/94 | HR 75 | Ht 68.0 in | Wt 189.8 lb

## 2021-08-23 DIAGNOSIS — R079 Chest pain, unspecified: Secondary | ICD-10-CM

## 2021-08-23 DIAGNOSIS — E119 Type 2 diabetes mellitus without complications: Secondary | ICD-10-CM | POA: Diagnosis not present

## 2021-08-23 DIAGNOSIS — I10 Essential (primary) hypertension: Secondary | ICD-10-CM

## 2021-08-23 DIAGNOSIS — E7800 Pure hypercholesterolemia, unspecified: Secondary | ICD-10-CM

## 2021-08-23 NOTE — Patient Instructions (Signed)
Medication Instructions:  No changes *If you need a refill on your cardiac medications before your next appointment, please call your pharmacy*   Lab Work: None ordered If you have labs (blood work) drawn today and your tests are completely normal, you will receive your results only by: [place] (if you have MyChart) OR A paper copy in the mail If you have any lab test that is abnormal or we need to change your treatment, we will call you to review the results.   Testing/Procedures: None ordered   Follow-Up: At [facility], you and your health needs are our priority.  As part of our continuing mission to provide you with exceptional heart care, we have created designated Provider Care Teams.  These Care Teams include your primary Cardiologist (physician) and Advanced Practice Providers (APPs -  Physician Assistants and Nurse Practitioners) who all work together to provide you with the care you need, when you need it.  We recommend signing up for the patient portal called "MyChart".  Sign up information is provided on this After Visit Summary.  MyChart is used to connect with patients for Virtual Visits (Telemedicine).  Patients are able to view lab/test results, encounter notes, upcoming appointments, etc.  Non-urgent messages can be sent to your provider as well.   To learn more about what you can do with MyChart, go to [email or web address].    Your next appointment:   12 month(s)  The format for your next appointment:   In Person  Provider:   You may see Dr. Sallyanne Kuster or one of the following Advanced Practice Providers on your designated Care Team:   Almyra Deforest, PA-C Fabian Sharp, [place] or  Roby Lofts, [place]   Other Instructions Dr. Sallyanne Kuster would like you to check your blood pressure daily for the next 2 weeks.  Keep a journal of these daily blood pressure and heart rate readings and call our office or send a message through [place] with the results. Thank  you!  It is best to check your BP 1-2 hours after taking your medications to see the medications effectiveness on your BP.    Here are some tips that our clinical pharmacists share for home BP monitoring:          Rest 10 minutes before taking your blood pressure.          Don't smoke or drink caffeinated beverages for at least 30 minutes before.          Take your blood pressure before (not after) you eat.          Sit comfortably with your back supported and both feet on the floor (don't cross your legs).          Elevate your arm to heart level on a table or a desk.          Use the proper sized cuff. It should fit smoothly and snugly around your bare upper arm. There should be enough room to slip a fingertip under the cuff. The bottom edge of the cuff should be 1 inch above the crease of the elbow.

## 2021-08-23 NOTE — Progress Notes (Signed)
Cardiology Office Note:    Date:  08/27/2021   ID:  Sophia Higgins, DOB 04-05-1961, MRN [id number]  PCP:  Marda Stalker, PA-C   [facility] Providers Cardiologist:  Sanda Klein, MD     Referring MD: Marda Stalker, PA-C   Chief Complaint  Patient presents with   Consult  Sophia Higgins is a 60 y.o. female who is being seen today for the evaluation of chest discomfort at the request of Marda Stalker, [place].   History of Present Illness:    Sophia Higgins is a 60 y.o. female with a hx of type 2 diabetes mellitus, hypertension, mild hypercholesterolemia, nephrolithiasis referred for ongoing chest discomfort.  She was initially referred about 3 months ago and during that time her symptoms have gradually improved and no longer bother her.  She had lingering chest discomfort that would occur at rest in the middle of her chest, without obvious precipitating or ameliorating factors.  She was able to perform household chores without any worsening of the symptoms.  There is no associated dyspnea.  She denies palpitations, syncope, focal neurological complaints or claudication.  She has had some repeated episodes of intermittent tingling in her extremities and burning/tingling down the middle of her back.  Previous MRI of the cervical spine has shown some disc degeneration at C5-C6 and C6/C7 but without evidence of neural impingement MRI of the lumbar spine was unremarkable.  Diabetes control has been improving rapidly.  The hemoglobin A1c was as high as 10.7% in late 2019, but has decreased to 5.8%.  She is on metformin 1 therapy and most of the improvement has been due to her improved diet.  She has lost substantial weight and is now overweight, rather than obese.  When labs were checked off statin therapy in April of this year her LDL cholesterol 121.  She has resumed atorvastatin 40 mg daily after that.  She has normal renal function with a creatinine of 1.1. She does not smoke  and has never done so.  Reviewed a CT of the abdomen and pelvis from 2016.  I do not see any evidence of atherosclerotic calcifications in the aorta or its branches.  Her family history significant for early onset heart disease in her father at age 72.  He had a heart attack.  Her 8 siblings are healthy and her mother passed away at age 52 from other problems.  Past Medical History:  Diagnosis Date   Diabetes mellitus, type II ([place])    Hypertension    Kidney stone 2016   Dr. Alyson Ingles    Past Surgical History:  Procedure Laterality Date   COLONOSCOPY     CYSTOSCOPY WITH RETROGRADE PYELOGRAM, URETEROSCOPY AND STENT PLACEMENT Right 05/17/2015   Procedure: [place], URETEROSCOPY AND STENT PLACEMENT;  Surgeon: Cleon Gustin, MD;  Location: [facility];  Service: Urology;  Laterality: Right;   HOLMIUM LASER APPLICATION Right 6/33/3545   Procedure: HOLMIUM LASER APPLICATION;  Surgeon: Cleon Gustin, MD;  Location: [facility];  Service: Urology;  Laterality: Right;   LIVER BIOPSY  01/2019   TONSILLECTOMY      Current Medications: Current Meds  Medication Sig   ASPIRIN PO Take by mouth as needed.   atorvastatin (LIPITOR) 40 MG tablet 1 tablet   fluticasone (FLONASE) 50 MCG/ACT nasal spray Place 1 spray into both nostrils daily.   ibuprofen (ADVIL) 800 MG tablet Take 800 mg by mouth every 6 (six) hours as needed.   lisinopril (PRINIVIL,ZESTRIL)  20 MG tablet Take 20 mg by mouth daily.   metFORMIN (GLUCOPHAGE-XR) 500 MG 24 hr tablet 2 tablets with a meal   Multiple Vitamins-Minerals (MULTIVITAMIN WITH MINERALS) tablet Take 1 tablet by mouth daily.   oxyCODONE-acetaminophen (PERCOCET) 5-325 MG tablet Take 1 tablet by mouth every 4 (four) hours as needed for severe pain.     Allergies:   Patient has no known allergies.   Social History   Socioeconomic History   Marital status: Single    Spouse name: Not on file   Number of children: 1   Years of education:  Not on file   Highest education level: High school graduate  Occupational History   Not on file  Tobacco Use   Smoking status: Never   Smokeless tobacco: Never  Vaping Use   Vaping Use: Never used  Substance and Sexual Activity   Alcohol use: No   Drug use: Never   Sexual activity: Not on file  Other Topics Concern   Not on file  Social History Narrative   Lives at home with her daughter   Caffeine: decaf coffee   Right handed   Social Determinants of Health   Financial Resource Strain: Not on file  Food Insecurity: Not on file  Transportation Needs: Not on file  Physical Activity: Not on file  Stress: Not on file  Social Connections: Not on file     Family History: The patient's family history includes Diabetes in her father; Kidney disease in her brother.  ROS:   Please see the history of present illness.     All other systems reviewed and are negative.  EKGs/Labs/Other Studies Reviewed:    The following studies were reviewed today: Notes from PCP including imaging studies and labs  EKG:  EKG is  ordered today.  The ekg ordered today demonstrates normal sinus rhythm, completely normal tracing, QTC 446  Recent Labs: No results found for requested labs within last 8760 hours.  12/07/2020 Hemoglobin A1c 5.8%, creatinine 1.1, potassium 4.2 Recent Lipid Panel No results found for: CHOL, TRIG, HDL, CHOLHDL, VLDL, LDLCALC, LDLDIRECT 08/04/2020 on atorvastatin Cholesterol 143, triglycerides 85, HDL 49, LDL 78  03/09/2021 off atorvastatin Cholesterol 201, HDL 48, LDL 131, triglycerides 127  Risk Assessment/Calculations:           Physical Exam:    VS:  BP (!) 156/94 (BP Location: Left Arm, Patient Position: Sitting, Cuff Size: Normal)   Pulse 75   Ht 5\' 8"  (1.727 m)   Wt 86.1 kg   SpO2 95%   BMI 28.86 kg/m     Wt Readings from Last 3 Encounters:  08/23/21 86.1 kg  01/13/21 87.1 kg  09/22/19 78.9 kg     GEN:  Well nourished, well developed in no  acute distress HEENT: Normal NECK: No JVD; No carotid bruits LYMPHATICS: No lymphadenopathy CARDIAC: RRR, no murmurs, rubs, gallops RESPIRATORY:  Clear to auscultation without rales, wheezing or rhonchi  ABDOMEN: Soft, non-tender, non-distended MUSCULOSKELETAL:  No edema; No deformity  SKIN: Warm and dry NEUROLOGIC:  Alert and oriented x 3 PSYCHIATRIC:  Normal affect   ASSESSMENT:    1. Chest pain of uncertain etiology   2. Essential hypertension   3. Type 2 diabetes mellitus without complication, without long-term current use of insulin ([place])   4. Hypercholesterolemia    PLAN:    In order of problems listed above:  Chest pain: This sounds neuralgic, may be due to problems with her spine, but sounds decidedly not  ischemic in etiology.  She does have multiple coronary risk factors, but these are in large part well addressed.  She has lost weight, has excellent glycemic control and is back on atorvastatin which in the past lowered her LDL cholesterol to desirable range  She does not smoke.  At this point I do not think there is a need for further investigation.  The focus remains on risk factor mitigation and maintaining healthy lifestyle. HTN: When she first checked in today her blood pressure was high at 156/94.  It remained the elevated even after resting for 15 minutes.  She reports that at home it typically runs 120/80.  Indeed at her office appointment on 04/19/2021 her blood pressure was only 123/82.  Asked her to keep an eye on her blood pressure at home and send me a message if it is consistently over 130. DM: A1c 5.8%. HLP: restarted atorvastatin.Target LDL less than 100, preferably less than 70.         Medication Adjustments/Labs and Tests Ordered: Current medicines are reviewed at length with the patient today.  Concerns regarding medicines are outlined above.  Orders Placed This Encounter  Procedures   EKG 12-Lead   No orders of the defined types were placed in this  encounter.   Patient Instructions  Medication Instructions:  No changes *If you need a refill on your cardiac medications before your next appointment, please call your pharmacy*   Lab Work: None ordered If you have labs (blood work) drawn today and your tests are completely normal, you will receive your results only by: [place] (if you have MyChart) OR A paper copy in the mail If you have any lab test that is abnormal or we need to change your treatment, we will call you to review the results.   Testing/Procedures: None ordered   Follow-Up: At [facility], you and your health needs are our priority.  As part of our continuing mission to provide you with exceptional heart care, we have created designated Provider Care Teams.  These Care Teams include your primary Cardiologist (physician) and Advanced Practice Providers (APPs -  Physician Assistants and Nurse Practitioners) who all work together to provide you with the care you need, when you need it.  We recommend signing up for the patient portal called "MyChart".  Sign up information is provided on this After Visit Summary.  MyChart is used to connect with patients for Virtual Visits (Telemedicine).  Patients are able to view lab/test results, encounter notes, upcoming appointments, etc.  Non-urgent messages can be sent to your provider as well.   To learn more about what you can do with MyChart, go to [email or web address].    Your next appointment:   12 month(s)  The format for your next appointment:   In Person  Provider:   You may see Dr. Sallyanne Kuster or one of the following Advanced Practice Providers on your designated Care Team:   Almyra Deforest, PA-C Fabian Sharp, [place] or  Roby Lofts, [place]   Other Instructions Dr. Sallyanne Kuster would like you to check your blood pressure daily for the next 2 weeks.  Keep a journal of these daily blood pressure and heart rate readings and call our office or send a message through  [place] with the results. Thank you!  It is best to check your BP 1-2 hours after taking your medications to see the medications effectiveness on your BP.    Here are some tips that our clinical pharmacists share for home  BP monitoring:          Rest 10 minutes before taking your blood pressure.          Don't smoke or drink caffeinated beverages for at least 30 minutes before.          Take your blood pressure before (not after) you eat.          Sit comfortably with your back supported and both feet on the floor (don't cross your legs).          Elevate your arm to heart level on a table or a desk.          Use the proper sized cuff. It should fit smoothly and snugly around your bare upper arm. There should be enough room to slip a fingertip under the cuff. The bottom edge of the cuff should be 1 inch above the crease of the elbow.    Signed, Sanda Klein, MD  08/27/2021 11:29 AM    [facility]

## 2021-08-27 ENCOUNTER — Encounter: Payer: Self-pay | Admitting: Cardiovascular Disease

## 2021-08-27 DIAGNOSIS — E7800 Pure hypercholesterolemia, unspecified: Secondary | ICD-10-CM | POA: Insufficient documentation

## 2021-08-27 DIAGNOSIS — R079 Chest pain, unspecified: Secondary | ICD-10-CM | POA: Insufficient documentation

## 2021-09-09 NOTE — Telephone Encounter (Signed)
If she is having issue should come in. She should not need this medicine at this point postop

## 2021-09-13 ENCOUNTER — Ambulatory Visit (INDEPENDENT_AMBULATORY_CARE_PROVIDER_SITE_OTHER): Payer: 59

## 2021-09-13 ENCOUNTER — Other Ambulatory Visit: Payer: Self-pay

## 2021-09-13 DIAGNOSIS — Z9889 Other specified postprocedural states: Secondary | ICD-10-CM | POA: Diagnosis not present

## 2021-09-13 NOTE — Progress Notes (Signed)
Patient in office for x-ray POV #3. X-ray obtained without complication. Patient is able wear supportive shoes without difficulties. Patient is attending physical therapy as ordered and stated it is very helpful. No concerns voiced during this visit. Advised patient to follow-up as needed. Patient verbalized understanding.

## 2022-04-13 ENCOUNTER — Ambulatory Visit: Payer: 59 | Admitting: Podiatry

## 2022-04-14 ENCOUNTER — Ambulatory Visit (INDEPENDENT_AMBULATORY_CARE_PROVIDER_SITE_OTHER): Payer: 59

## 2022-04-14 ENCOUNTER — Ambulatory Visit: Payer: 59 | Admitting: Podiatry

## 2022-04-14 DIAGNOSIS — Z9889 Other specified postprocedural states: Secondary | ICD-10-CM

## 2022-04-14 DIAGNOSIS — M778 Other enthesopathies, not elsewhere classified: Secondary | ICD-10-CM

## 2022-04-14 MED ORDER — TRIAMCINOLONE ACETONIDE 10 MG/ML IJ SUSP
10.0000 mg | Freq: Once | INTRAMUSCULAR | Status: AC
  Administered 2022-04-14: 10 mg

## 2022-04-14 NOTE — Progress Notes (Signed)
Subjective:  ? ?Patient ID: Sophia Higgins, female   DOB: 61 y.o.   MRN: [id number]  ? ?HPI ?Patient presents stating she is still getting some swelling of her first interspace and around the side of the joint surface stating that the pain is significantly less than when we started before surgery and she is very happy with that but she would like to try to get rid of this inflammation that she has ? ? ?ROS ? ? ?   ?Objective:  ?Physical Exam  ?Neurovascular status intact with inflammation of the lateral side of the joint and into the first interspace left ? ?   ?Assessment:  ?Inflammatory changes that are present but improved left with excellent correction of hallux limitus deformity with good range of motion no crepitus ? ?   ?Plan:  ?H&P x-rays reviewed sterile prep and did inject the first interspace and lateral side of the joint surface 3 mg Dexasone Kenalog 5 mg Xylocaine advised on heat therapy patient will be seen back as needed ? ?X-rays indicate that the joint looks healthy there is some small lesions within it but I do not think this will be a problem for her with fixation in place good alignment and excellent clinical ?   ? ? ?
